# Patient Record
Sex: Female | Born: 1975 | Race: White | Hispanic: No | Marital: Married | Smoking: Current every day smoker
Health system: Southern US, Community
[De-identification: ages and names within clinical notes are randomized; demographics above are authoritative.]

## PROBLEM LIST (undated history)

## (undated) DIAGNOSIS — F319 Bipolar disorder, unspecified: Secondary | ICD-10-CM

## (undated) DIAGNOSIS — F419 Anxiety disorder, unspecified: Secondary | ICD-10-CM

## (undated) HISTORY — PX: KNEE SURGERY: SHX244

## (undated) HISTORY — PX: TUBAL LIGATION: SHX77

## (undated) HISTORY — PX: SALPINGECTOMY: SHX328

---

## 2003-12-20 ENCOUNTER — Other Ambulatory Visit: Payer: Self-pay

## 2004-10-21 ENCOUNTER — Emergency Department: Payer: Self-pay | Admitting: Emergency Medicine

## 2004-10-29 ENCOUNTER — Emergency Department: Payer: Self-pay | Admitting: Emergency Medicine

## 2004-12-03 ENCOUNTER — Emergency Department: Payer: Self-pay | Admitting: Unknown Physician Specialty

## 2004-12-05 ENCOUNTER — Ambulatory Visit: Payer: Self-pay | Admitting: Unknown Physician Specialty

## 2005-01-12 ENCOUNTER — Ambulatory Visit: Payer: Self-pay | Admitting: Obstetrics & Gynecology

## 2005-04-20 ENCOUNTER — Ambulatory Visit: Payer: Self-pay

## 2005-04-27 ENCOUNTER — Observation Stay: Payer: Self-pay

## 2005-05-25 ENCOUNTER — Observation Stay: Payer: Self-pay | Admitting: Obstetrics & Gynecology

## 2005-06-21 ENCOUNTER — Observation Stay: Payer: Self-pay

## 2005-07-03 ENCOUNTER — Observation Stay: Payer: Self-pay

## 2005-07-04 ENCOUNTER — Inpatient Hospital Stay: Payer: Self-pay | Admitting: Unknown Physician Specialty

## 2006-01-29 ENCOUNTER — Emergency Department: Payer: Self-pay | Admitting: Emergency Medicine

## 2006-04-04 ENCOUNTER — Emergency Department: Payer: Self-pay | Admitting: Emergency Medicine

## 2006-08-14 ENCOUNTER — Inpatient Hospital Stay: Payer: Self-pay

## 2006-09-27 ENCOUNTER — Ambulatory Visit: Payer: Self-pay | Admitting: Obstetrics & Gynecology

## 2007-06-07 ENCOUNTER — Emergency Department: Payer: Self-pay | Admitting: Unknown Physician Specialty

## 2007-09-14 ENCOUNTER — Emergency Department: Payer: Self-pay | Admitting: Emergency Medicine

## 2007-11-29 ENCOUNTER — Emergency Department: Payer: Self-pay | Admitting: Emergency Medicine

## 2008-05-27 ENCOUNTER — Emergency Department: Payer: Self-pay | Admitting: Emergency Medicine

## 2008-06-03 ENCOUNTER — Emergency Department: Payer: Self-pay | Admitting: Emergency Medicine

## 2008-12-01 ENCOUNTER — Emergency Department: Payer: Self-pay | Admitting: Emergency Medicine

## 2009-01-05 ENCOUNTER — Emergency Department: Payer: Self-pay | Admitting: Emergency Medicine

## 2009-09-15 ENCOUNTER — Emergency Department: Payer: Self-pay | Admitting: Emergency Medicine

## 2009-09-26 ENCOUNTER — Emergency Department: Payer: Self-pay | Admitting: Unknown Physician Specialty

## 2009-12-27 ENCOUNTER — Emergency Department: Payer: Self-pay | Admitting: Emergency Medicine

## 2010-03-15 ENCOUNTER — Emergency Department: Payer: Self-pay | Admitting: Emergency Medicine

## 2010-04-04 ENCOUNTER — Emergency Department (HOSPITAL_COMMUNITY): Admission: EM | Admit: 2010-04-04 | Discharge: 2010-04-04 | Payer: Self-pay | Admitting: Emergency Medicine

## 2010-04-11 ENCOUNTER — Emergency Department (HOSPITAL_COMMUNITY): Admission: EM | Admit: 2010-04-11 | Discharge: 2010-04-11 | Payer: Self-pay | Admitting: Emergency Medicine

## 2010-04-17 ENCOUNTER — Emergency Department: Payer: Self-pay | Admitting: Emergency Medicine

## 2010-05-23 ENCOUNTER — Emergency Department: Payer: Self-pay | Admitting: Unknown Physician Specialty

## 2010-06-18 ENCOUNTER — Emergency Department: Payer: Self-pay | Admitting: Emergency Medicine

## 2010-08-21 ENCOUNTER — Emergency Department: Payer: Self-pay | Admitting: Emergency Medicine

## 2010-10-24 ENCOUNTER — Emergency Department: Payer: Self-pay | Admitting: Emergency Medicine

## 2010-11-09 ENCOUNTER — Emergency Department (HOSPITAL_COMMUNITY)
Admission: EM | Admit: 2010-11-09 | Discharge: 2010-11-09 | Payer: Self-pay | Source: Home / Self Care | Admitting: Emergency Medicine

## 2011-01-04 ENCOUNTER — Emergency Department: Payer: Self-pay | Admitting: Unknown Physician Specialty

## 2011-01-15 ENCOUNTER — Emergency Department: Payer: Self-pay | Admitting: Emergency Medicine

## 2011-01-16 ENCOUNTER — Emergency Department (HOSPITAL_COMMUNITY): Payer: Medicaid Other

## 2011-01-16 ENCOUNTER — Emergency Department (HOSPITAL_COMMUNITY)
Admission: EM | Admit: 2011-01-16 | Discharge: 2011-01-16 | Disposition: A | Discharge status: Home / Self Care | Payer: Medicaid Other | Attending: Emergency Medicine | Admitting: Emergency Medicine

## 2011-01-16 DIAGNOSIS — M79609 Pain in unspecified limb: Secondary | ICD-10-CM | POA: Insufficient documentation

## 2011-01-16 DIAGNOSIS — S60229A Contusion of unspecified hand, initial encounter: Secondary | ICD-10-CM | POA: Insufficient documentation

## 2011-01-16 DIAGNOSIS — W2209XA Striking against other stationary object, initial encounter: Secondary | ICD-10-CM | POA: Insufficient documentation

## 2011-01-16 DIAGNOSIS — F319 Bipolar disorder, unspecified: Secondary | ICD-10-CM | POA: Insufficient documentation

## 2011-01-16 DIAGNOSIS — Z79899 Other long term (current) drug therapy: Secondary | ICD-10-CM | POA: Insufficient documentation

## 2011-01-16 DIAGNOSIS — Y92009 Unspecified place in unspecified non-institutional (private) residence as the place of occurrence of the external cause: Secondary | ICD-10-CM | POA: Insufficient documentation

## 2011-01-16 DIAGNOSIS — S6990XA Unspecified injury of unspecified wrist, hand and finger(s), initial encounter: Secondary | ICD-10-CM | POA: Insufficient documentation

## 2011-01-16 DIAGNOSIS — J45909 Unspecified asthma, uncomplicated: Secondary | ICD-10-CM | POA: Insufficient documentation

## 2011-01-30 ENCOUNTER — Emergency Department: Payer: Self-pay | Admitting: Emergency Medicine

## 2011-10-11 ENCOUNTER — Emergency Department: Payer: Self-pay

## 2011-11-16 IMAGING — CR DG HAND COMPLETE 3+V*R*
3 series · 3 of 3 positions shown · non-contrast
Comparison: None

CLINICAL DATA: Punched wall with right hand

RIGHT HAND - COMPLETE 3+ VIEW

[x hand pa right]
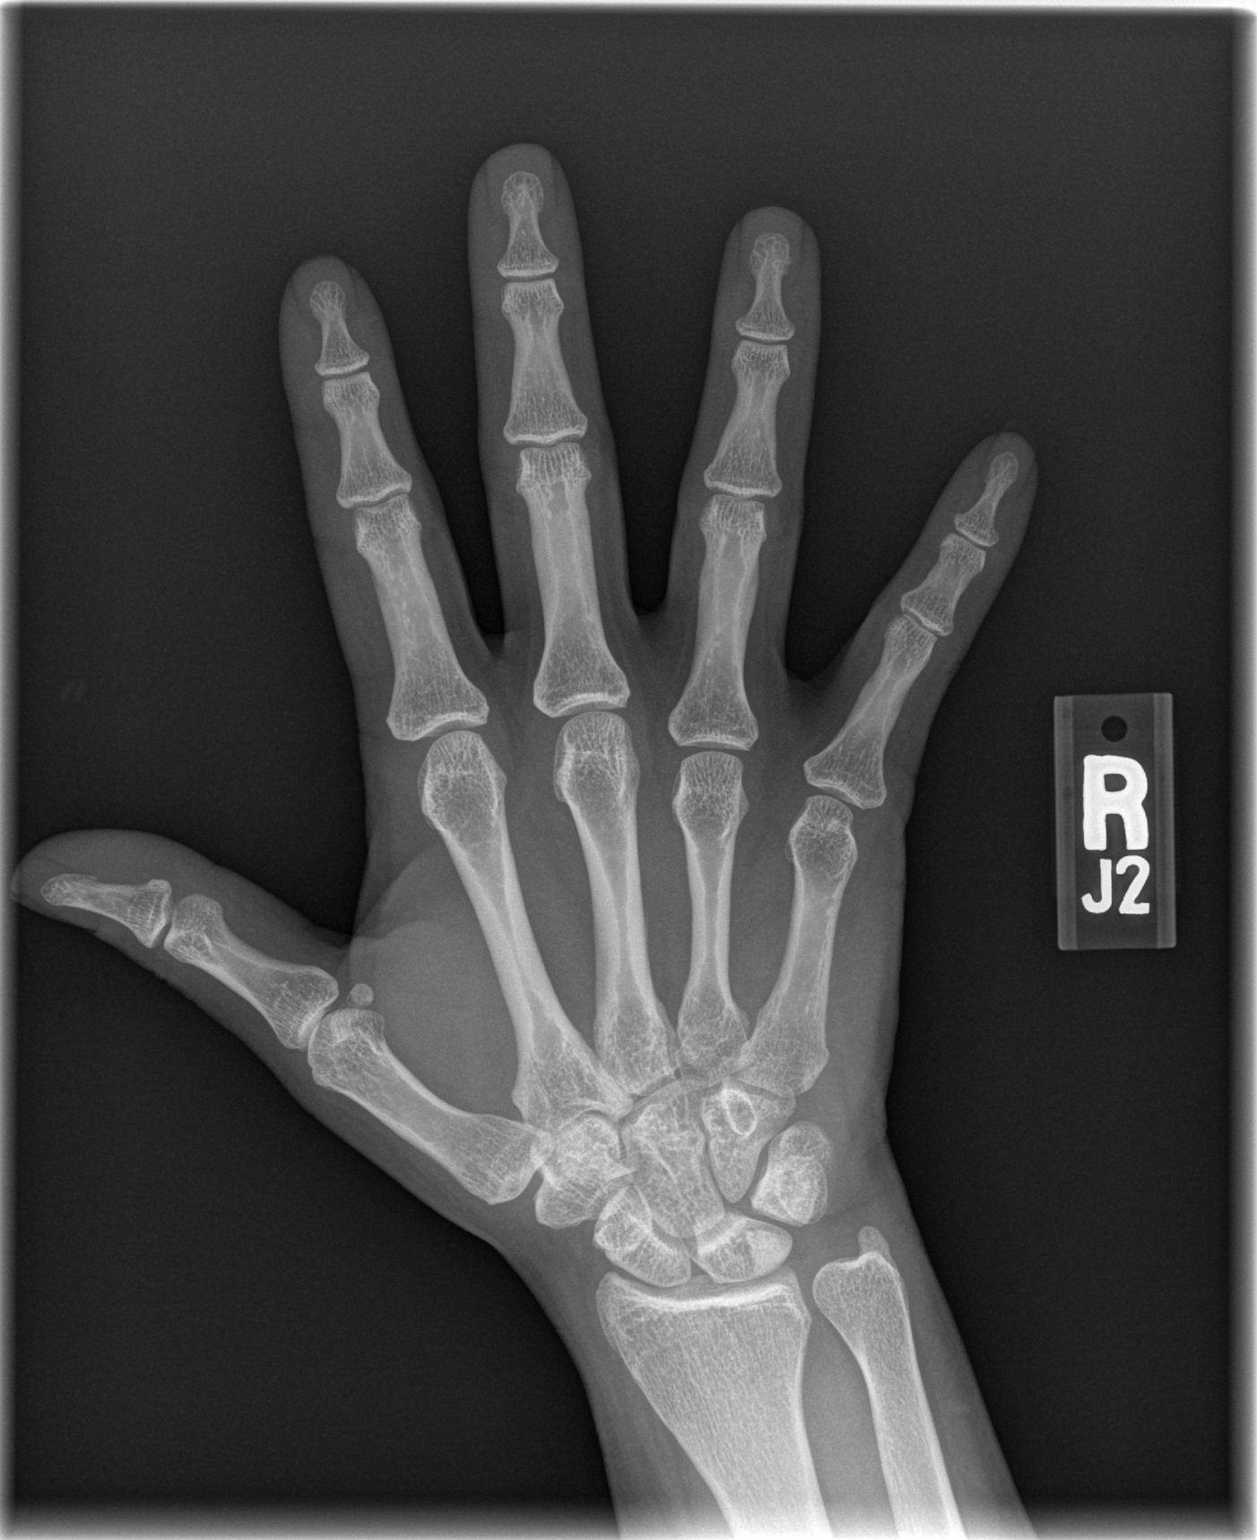

[x hand oblique right]
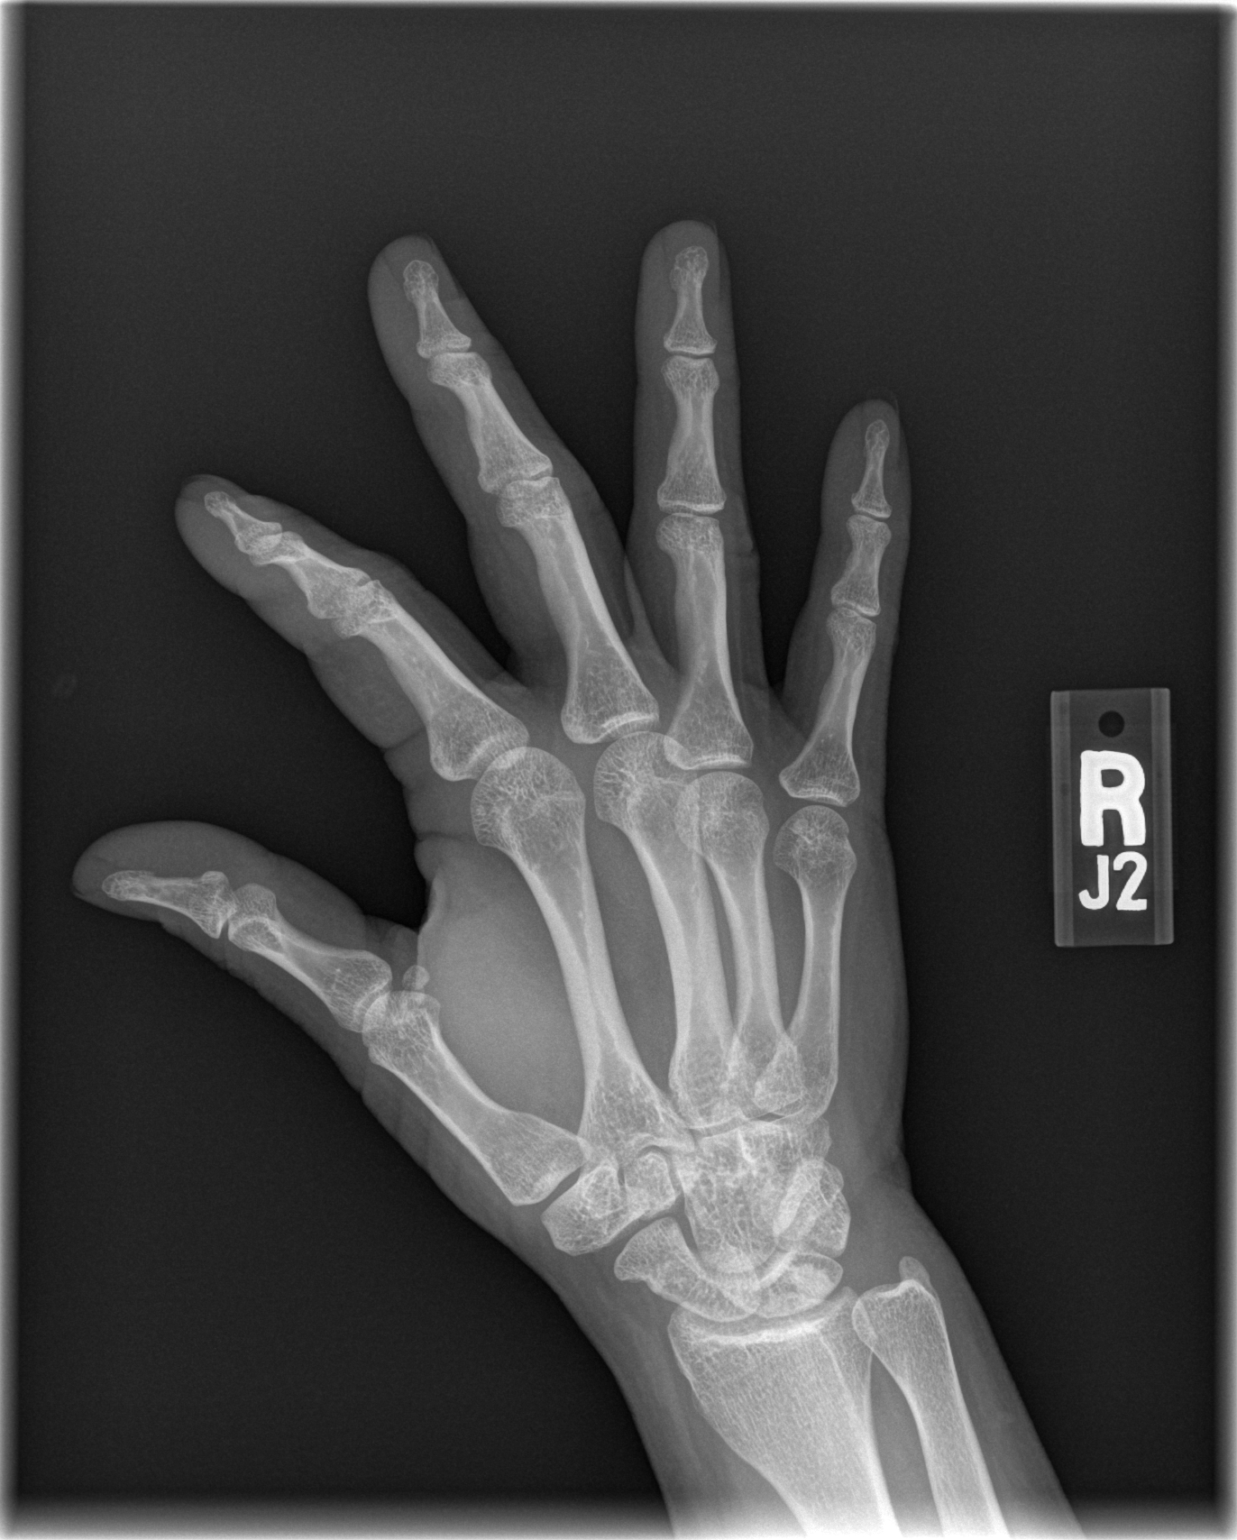

[x hand lat right]
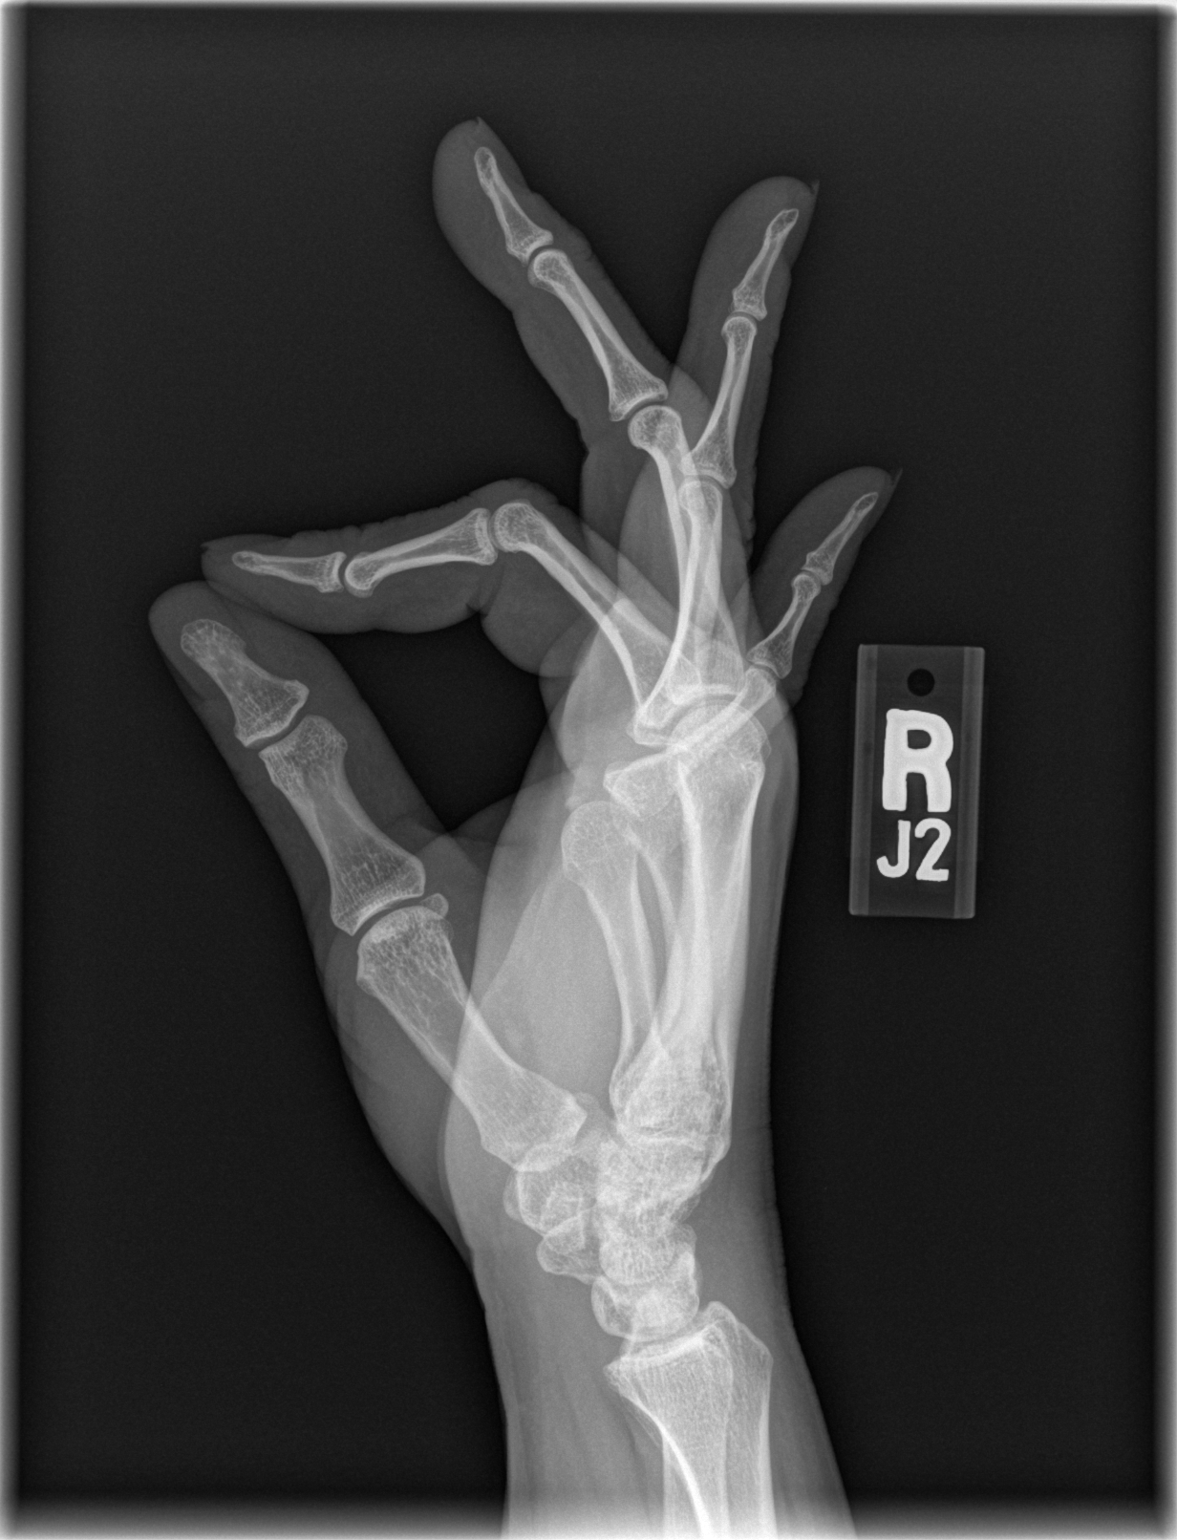

[3 of 3 positions shown; findings below may reference images not displayed]

FINDINGS: There is no evidence of fracture or dislocation.  There
is no evidence of arthropathy or other focal bone abnormality.
Soft tissues are unremarkable.
IMPRESSION: No acute findings.

## 2011-12-03 ENCOUNTER — Emergency Department: Payer: Self-pay | Admitting: Emergency Medicine

## 2011-12-03 LAB — CBC WITH DIFFERENTIAL/PLATELET
Basophil %: 0.6 %
Eosinophil #: 0.1 10*3/uL (ref 0.0–0.7)
MCH: 30.7 pg (ref 26.0–34.0)
MCHC: 33.6 g/dL (ref 32.0–36.0)
Monocyte #: 0.5 10*3/uL (ref 0.0–0.7)
Neutrophil %: 60 %
Platelet: 233 10*3/uL (ref 150–440)

## 2011-12-03 LAB — COMPREHENSIVE METABOLIC PANEL
Albumin: 3.8 g/dL (ref 3.4–5.0)
Anion Gap: 7 (ref 7–16)
BUN: 12 mg/dL (ref 7–18)
Calcium, Total: 9.3 mg/dL (ref 8.5–10.1)
Glucose: 75 mg/dL (ref 65–99)
SGOT(AST): 20 U/L (ref 15–37)
SGPT (ALT): 29 U/L
Total Protein: 7.2 g/dL (ref 6.4–8.2)

## 2011-12-03 LAB — LIPASE, BLOOD: Lipase: 235 U/L (ref 73–393)

## 2011-12-04 LAB — URINALYSIS, COMPLETE
Ketone: NEGATIVE
Ph: 5 (ref 4.5–8.0)
Protein: NEGATIVE
Specific Gravity: 1.027 (ref 1.003–1.030)
Squamous Epithelial: 6
WBC UR: 1 /HPF (ref 0–5)

## 2011-12-04 LAB — WET PREP, GENITAL

## 2011-12-04 LAB — PREGNANCY, URINE: Pregnancy Test, Urine: NEGATIVE m[IU]/mL

## 2011-12-13 ENCOUNTER — Emergency Department: Payer: Self-pay | Admitting: *Deleted

## 2011-12-13 LAB — URINALYSIS, COMPLETE
Bacteria: NONE SEEN
Bilirubin,UR: NEGATIVE
Glucose,UR: NEGATIVE mg/dL (ref 0–75)
Ketone: NEGATIVE
Leukocyte Esterase: NEGATIVE
Ph: 6 (ref 4.5–8.0)
RBC,UR: 9 /HPF (ref 0–5)
Squamous Epithelial: 4
WBC UR: 1 /HPF (ref 0–5)

## 2011-12-13 LAB — PREGNANCY, URINE: Pregnancy Test, Urine: NEGATIVE m[IU]/mL

## 2012-02-12 ENCOUNTER — Emergency Department: Payer: Self-pay | Admitting: Emergency Medicine

## 2012-07-02 ENCOUNTER — Emergency Department: Payer: Self-pay | Admitting: Emergency Medicine

## 2012-07-02 LAB — URINALYSIS, COMPLETE
Bacteria: NONE SEEN
Bilirubin,UR: NEGATIVE
Glucose,UR: NEGATIVE mg/dL (ref 0–75)
Leukocyte Esterase: NEGATIVE
Specific Gravity: 1.014 (ref 1.003–1.030)
Squamous Epithelial: 14
WBC UR: 1 /HPF (ref 0–5)

## 2012-07-15 ENCOUNTER — Emergency Department: Payer: Self-pay | Admitting: Emergency Medicine

## 2012-07-15 LAB — CBC
HCT: 42.4 % (ref 35.0–47.0)
HGB: 14.1 g/dL (ref 12.0–16.0)
MCH: 30.4 pg (ref 26.0–34.0)
MCHC: 33.2 g/dL (ref 32.0–36.0)
RDW: 13.4 % (ref 11.5–14.5)

## 2012-07-15 LAB — URINALYSIS, COMPLETE
Bacteria: NONE SEEN
Bilirubin,UR: NEGATIVE
Glucose,UR: NEGATIVE mg/dL (ref 0–75)
Ketone: NEGATIVE
Leukocyte Esterase: NEGATIVE
Ph: 6 (ref 4.5–8.0)
RBC,UR: <1 /HPF (ref 0–5)
Squamous Epithelial: <1
WBC UR: 1 /HPF (ref 0–5)

## 2012-07-15 LAB — COMPREHENSIVE METABOLIC PANEL
Albumin: 3.7 g/dL (ref 3.4–5.0)
Alkaline Phosphatase: 62 U/L (ref 50–136)
Calcium, Total: 9.1 mg/dL (ref 8.5–10.1)
Glucose: 93 mg/dL (ref 65–99)
Osmolality: 281 (ref 275–301)
Sodium: 142 mmol/L (ref 136–145)

## 2012-07-15 LAB — PREGNANCY, URINE: Pregnancy Test, Urine: NEGATIVE m[IU]/mL

## 2012-08-05 ENCOUNTER — Ambulatory Visit: Payer: Self-pay | Admitting: Obstetrics & Gynecology

## 2012-08-05 LAB — CBC
HGB: 13.1 g/dL (ref 12.0–16.0)
MCH: 31.1 pg (ref 26.0–34.0)
RBC: 4.21 10*6/uL (ref 3.80–5.20)
WBC: 11.9 10*3/uL — ABNORMAL HIGH (ref 3.6–11.0)

## 2012-08-06 ENCOUNTER — Ambulatory Visit: Payer: Self-pay | Admitting: Obstetrics & Gynecology

## 2012-08-06 DIAGNOSIS — I498 Other specified cardiac arrhythmias: Secondary | ICD-10-CM

## 2012-10-02 ENCOUNTER — Emergency Department: Payer: Self-pay

## 2012-10-12 IMAGING — CT CT STONE STUDY
1 of 2 series · 15 of 32 positions shown, 19 images · non-contrast
Comparison: none

REASON FOR EXAM: left sided pain
COMMENTS:

PROCEDURE:     CT  - CT ABDOMEN /PELVIS WO (STONE)  - December 13, 2011  [DATE]
RESULT:     CT abdomen and pelvis dated 12/13/2011.
TECHNIQUE: Helical noncontrasted 3 mm sections were obtained from lung bases
through the pubic symphysis.

[Series 2: stone · axial · 0.73mm/px · z∈[-470,-62]mm · 15 of 154 slices shown, 19 images]
[im 12/154  soft-tissue]
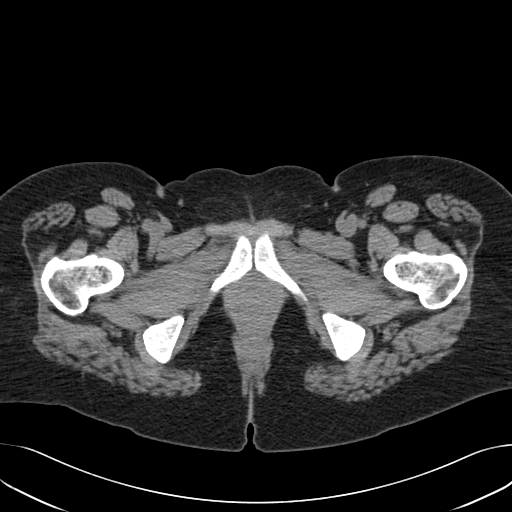
[im 12/154  bone]
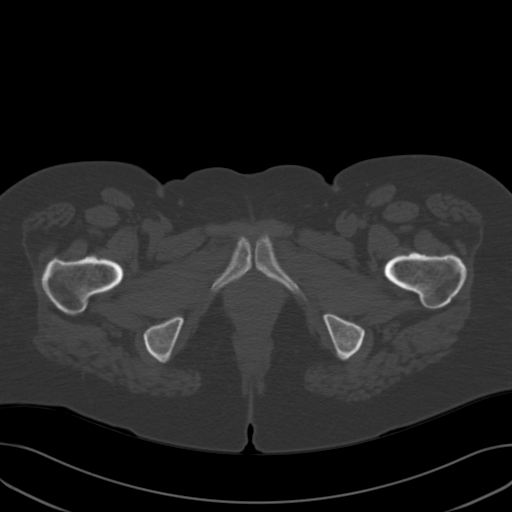
[im 23/154  soft-tissue]
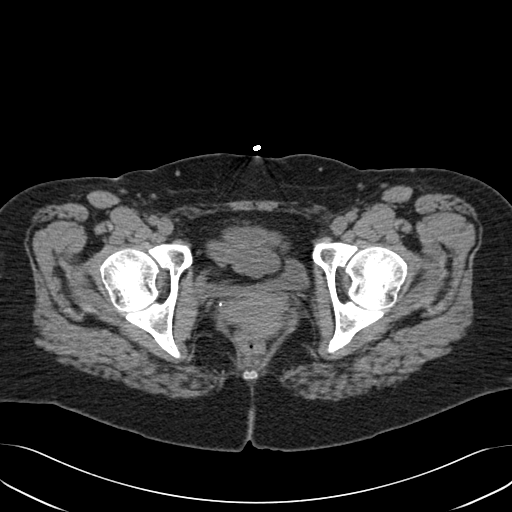
[im 35/154  soft-tissue]
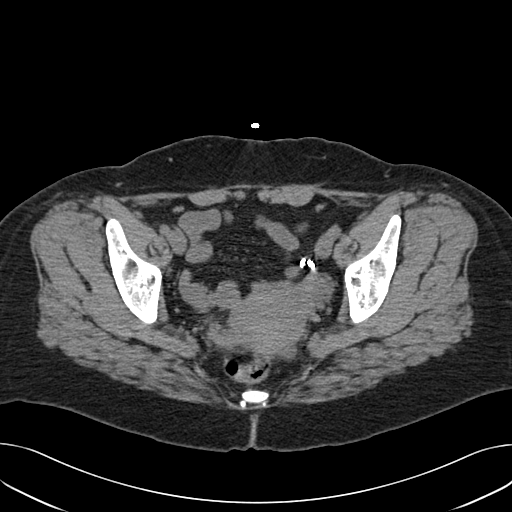
[im 46/154  soft-tissue]
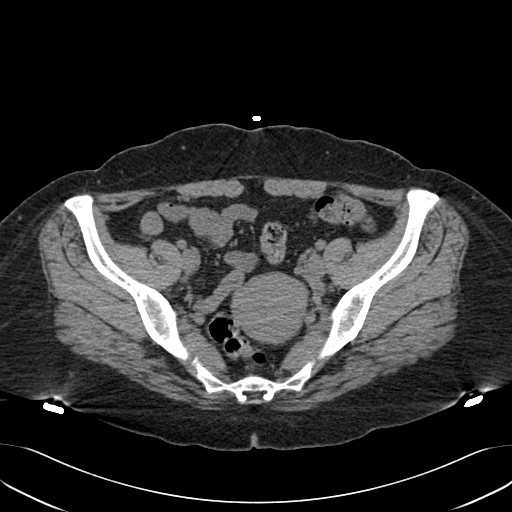
[im 57/154  soft-tissue]
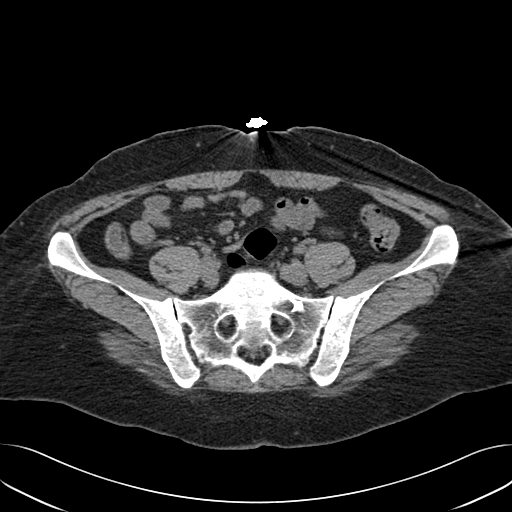
[im 69/154  soft-tissue]
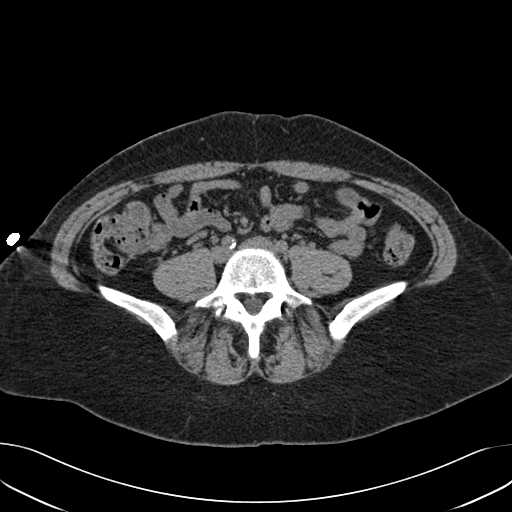
[im 80/154  soft-tissue]
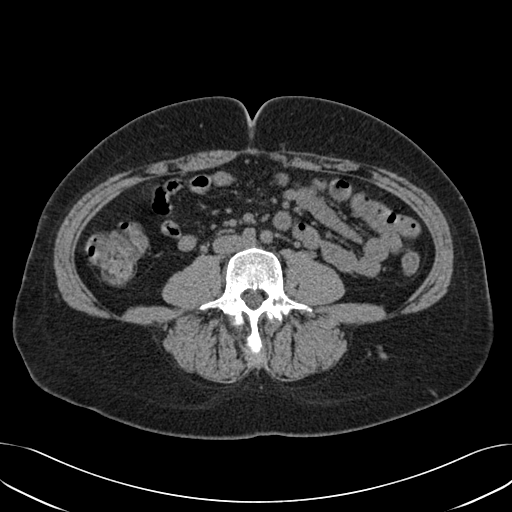
[im 91/154  soft-tissue]
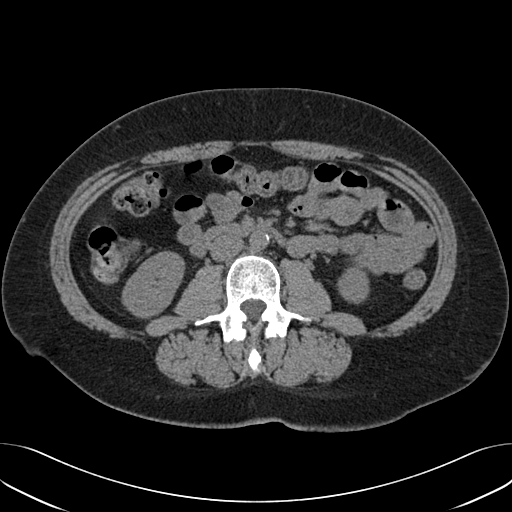
[im 103/154  soft-tissue]
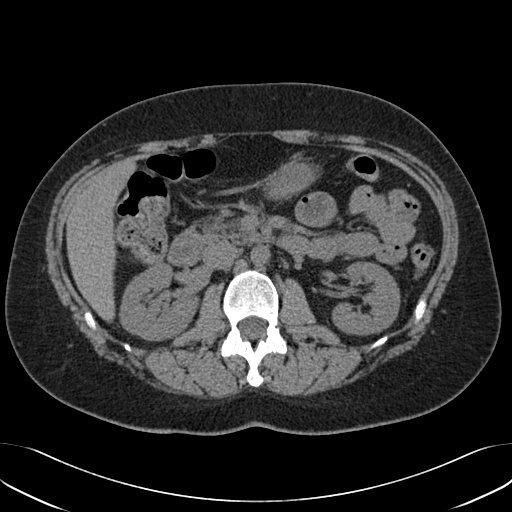
[im 103/154  bone]
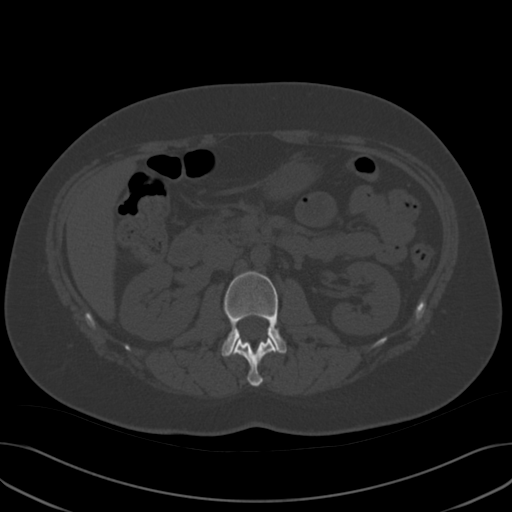
[im 114/154  soft-tissue]
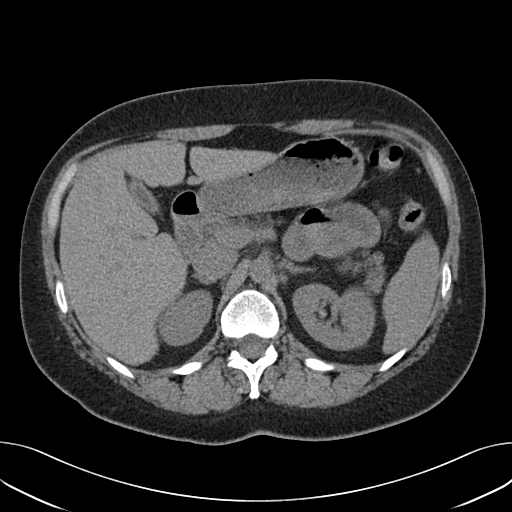
[im 125/154  soft-tissue]
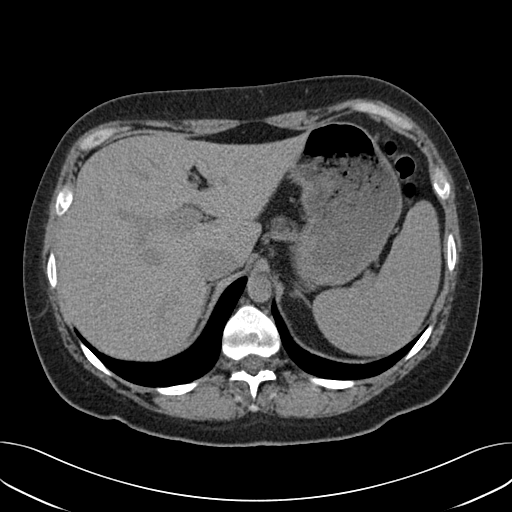
[im 131/154  lung]
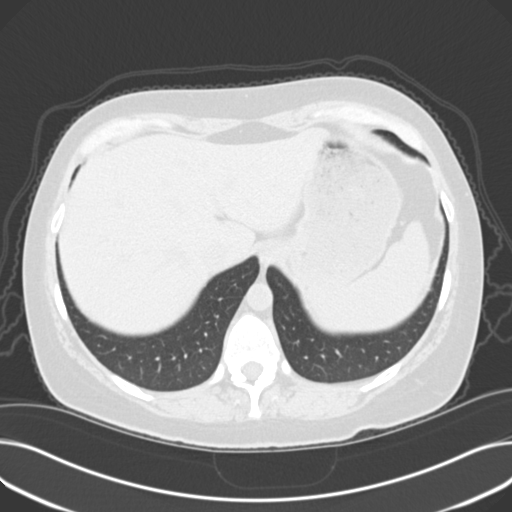
[im 137/154  soft-tissue]
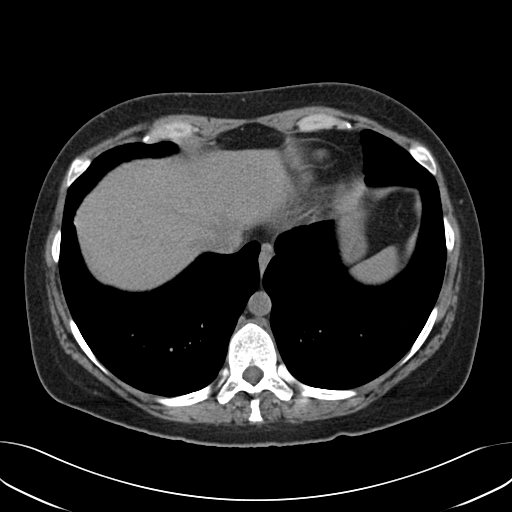
[im 137/154  lung]
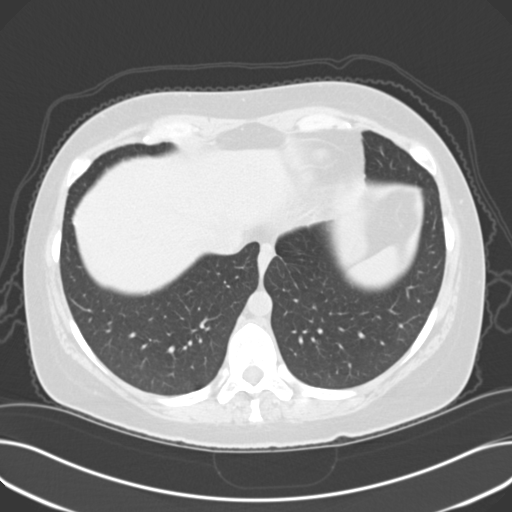
[im 142/154  lung]
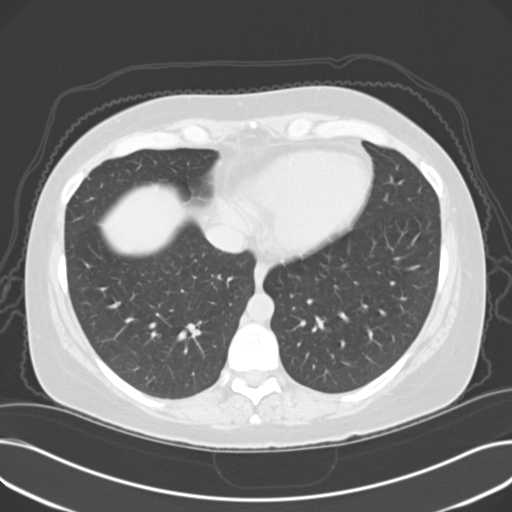
[im 148/154  soft-tissue]
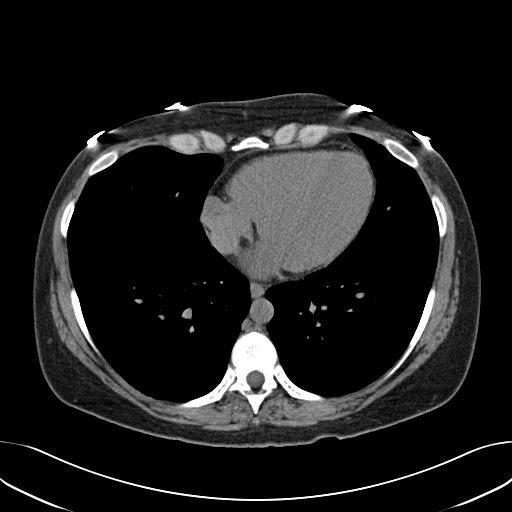
[im 148/154  lung]
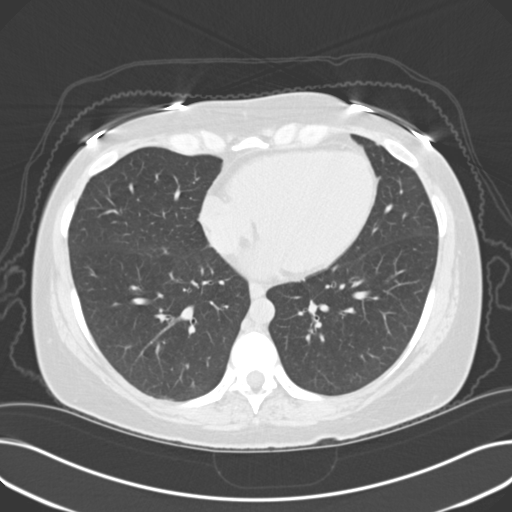

[15 of 32 positions shown; findings below may reference images not displayed]

FINDINGS: The lung bases demonstrate a calcified granuloma within the
posterior right lung base, otherwise unremarkable.

Noncontrast evaluation of the liver, spleen, adrenals, pancreas are
unremarkable.

No evidence of hydronephrosis, hydroureter, nor nephrolithiasis. There is no
evidence of ureterolithiasis.

No CT evidence of bowel obstruction nor secondary signs reflecting
enteritis, colitis, diverticulitis, nor appendicitis the appendix is
identified and is unremarkable. Nonspecific subcentimeter retroperitoneal
lymph nodes identified. No evidence of an abnormal aortic aneurysm.
IMPRESSION: No CT evidence of obstructive or inflammatory abnormalities.

## 2013-01-22 ENCOUNTER — Emergency Department: Payer: Self-pay | Admitting: Emergency Medicine

## 2013-03-23 ENCOUNTER — Emergency Department: Payer: Self-pay | Admitting: Internal Medicine

## 2013-10-16 ENCOUNTER — Encounter: Payer: Self-pay | Admitting: Anesthesiology

## 2013-10-27 ENCOUNTER — Encounter: Payer: Self-pay | Admitting: Anesthesiology

## 2014-10-16 ENCOUNTER — Emergency Department: Payer: Self-pay | Admitting: Internal Medicine

## 2014-10-16 LAB — COMPREHENSIVE METABOLIC PANEL
ANION GAP: 4 — AB (ref 7–16)
Albumin: 3.7 g/dL (ref 3.4–5.0)
Alkaline Phosphatase: 60 U/L
BILIRUBIN TOTAL: 0.2 mg/dL (ref 0.2–1.0)
BUN: 13 mg/dL (ref 7–18)
CREATININE: 0.91 mg/dL (ref 0.60–1.30)
Calcium, Total: 8.6 mg/dL (ref 8.5–10.1)
Chloride: 106 mmol/L (ref 98–107)
Co2: 30 mmol/L (ref 21–32)
GLUCOSE: 89 mg/dL (ref 65–99)
OSMOLALITY: 279 (ref 275–301)
Potassium: 4.1 mmol/L (ref 3.5–5.1)
SGOT(AST): 17 U/L (ref 15–37)
SGPT (ALT): 16 U/L
Sodium: 140 mmol/L (ref 136–145)
Total Protein: 7.2 g/dL (ref 6.4–8.2)

## 2014-10-16 LAB — CBC
HCT: 43 % (ref 35.0–47.0)
HGB: 13.9 g/dL (ref 12.0–16.0)
MCH: 30 pg (ref 26.0–34.0)
MCHC: 32.2 g/dL (ref 32.0–36.0)
MCV: 93 fL (ref 80–100)
PLATELETS: 216 10*3/uL (ref 150–440)
RBC: 4.62 10*6/uL (ref 3.80–5.20)
RDW: 13.2 % (ref 11.5–14.5)
WBC: 11 10*3/uL (ref 3.6–11.0)

## 2014-10-16 LAB — URINALYSIS, COMPLETE
RBC,UR: 1211 /HPF (ref 0–5)
Specific Gravity: 1.024 (ref 1.003–1.030)
WBC UR: 10 /HPF (ref 0–5)

## 2014-10-19 ENCOUNTER — Emergency Department: Payer: Self-pay | Admitting: Emergency Medicine

## 2014-10-19 LAB — URINALYSIS, COMPLETE
Bilirubin,UR: NEGATIVE
GLUCOSE, UR: NEGATIVE mg/dL (ref 0–75)
KETONE: NEGATIVE
LEUKOCYTE ESTERASE: NEGATIVE
NITRITE: NEGATIVE
PH: 7 (ref 4.5–8.0)
Protein: NEGATIVE
RBC,UR: 11 /HPF (ref 0–5)
SPECIFIC GRAVITY: 1.017 (ref 1.003–1.030)

## 2014-10-20 LAB — URINE CULTURE

## 2014-10-25 ENCOUNTER — Emergency Department: Payer: Self-pay | Admitting: Emergency Medicine

## 2014-10-25 LAB — URINALYSIS, COMPLETE
Bilirubin,UR: NEGATIVE
GLUCOSE, UR: NEGATIVE mg/dL (ref 0–75)
KETONE: NEGATIVE
NITRITE: NEGATIVE
PH: 6 (ref 4.5–8.0)
Protein: NEGATIVE
RBC,UR: 7 /HPF (ref 0–5)
Specific Gravity: 1.018 (ref 1.003–1.030)
Squamous Epithelial: 46

## 2014-11-17 ENCOUNTER — Emergency Department: Payer: Self-pay | Admitting: Emergency Medicine

## 2014-11-17 ENCOUNTER — Ambulatory Visit: Payer: Self-pay | Admitting: Urology

## 2014-11-23 ENCOUNTER — Emergency Department: Payer: Self-pay | Admitting: Emergency Medicine

## 2014-11-23 LAB — URINALYSIS, COMPLETE
BILIRUBIN, UR: NEGATIVE
GLUCOSE, UR: NEGATIVE mg/dL (ref 0–75)
KETONE: NEGATIVE
Leukocyte Esterase: NEGATIVE
NITRITE: NEGATIVE
PH: 5 (ref 4.5–8.0)
PROTEIN: NEGATIVE
RBC,UR: 6 /HPF (ref 0–5)
Specific Gravity: 1.015 (ref 1.003–1.030)
Squamous Epithelial: 4

## 2014-11-23 LAB — DRUG SCREEN, URINE
Amphetamines, Ur Screen: NEGATIVE (ref ?–1000)
BARBITURATES, UR SCREEN: NEGATIVE (ref ?–200)
Benzodiazepine, Ur Scrn: POSITIVE (ref ?–200)
CANNABINOID 50 NG, UR ~~LOC~~: POSITIVE (ref ?–50)
COCAINE METABOLITE, UR ~~LOC~~: NEGATIVE (ref ?–300)
MDMA (ECSTASY) UR SCREEN: NEGATIVE (ref ?–500)
Methadone, Ur Screen: NEGATIVE (ref ?–300)
Opiate, Ur Screen: POSITIVE (ref ?–300)
Phencyclidine (PCP) Ur S: NEGATIVE (ref ?–25)
Tricyclic, Ur Screen: NEGATIVE (ref ?–1000)

## 2014-12-28 ENCOUNTER — Emergency Department: Payer: Self-pay | Admitting: Emergency Medicine

## 2015-02-11 ENCOUNTER — Ambulatory Visit: Payer: Self-pay | Admitting: Obstetrics & Gynecology

## 2015-02-18 ENCOUNTER — Ambulatory Visit: Payer: Self-pay | Admitting: Obstetrics & Gynecology

## 2015-02-18 LAB — CBC
HCT: 41.9 % (ref 35.0–47.0)
HGB: 13.6 g/dL (ref 12.0–16.0)
MCH: 29.7 pg (ref 26.0–34.0)
MCHC: 32.4 g/dL (ref 32.0–36.0)
MCV: 92 fL (ref 80–100)
Platelet: 257 10*3/uL (ref 150–440)
RBC: 4.58 10*6/uL (ref 3.80–5.20)
RDW: 13.2 % (ref 11.5–14.5)
WBC: 9.5 10*3/uL (ref 3.6–11.0)

## 2015-02-23 ENCOUNTER — Ambulatory Visit: Payer: Self-pay | Admitting: Obstetrics & Gynecology

## 2015-03-16 NOTE — Op Note (Signed)
PATIENT NAME:  Denise FreestoneBROWN, Kella E MR#:  [id number] DATE OF BIRTH:  1976/06/10  DATE OF PROCEDURE:  08/06/2012  PREOPERATIVE DIAGNOSIS: Chronic pelvic pain.   POSTOPERATIVE DIAGNOSIS: Chronic pelvic pain.   PROCEDURE: Laparoscopy with left salpingectomy.   SURGEON: Dierdre Searles. Paul Kyshaun Barnette, MD  ANESTHESIA: General.   ESTIMATED BLOOD LOSS: Minimal.   COMPLICATIONS: None.   FINDINGS: No evidence for adhesions or endometriosis.   DISPOSITION: To recovery room in stable condition.   TECHNIQUE: Patient is prepped and draped in the usual sterile fashion after adequate anesthesia is obtained in the dorsal lithotomy position. Bladder is drained with a Robinson catheter and a sponge stick is placed per vagina for manipulation purposes.   Attention is then turned to the abdomen where a Veress needle is inserted through a 5 mm infraumbilical incision after Marcaine is used to anesthetize the skin. Veress needle placement is confirmed using the hanging drop technique. The abdomen is then insufflated with CO2 gas. 5 mm trocar inserted under direct visualization with the laparoscope with no injuries or bleeding noted. Patient is placed in Trendelenburg position with the above-mentioned findings visualized.   A left lower quadrant 11 mm trocar is placed and a 5 mm suprapubic trocar is placed for manipulation instruments for the procedure. The left fallopian tube is grasped, identified. Two Hulka clips are in place on this fallopian tube. There is no significant damage to the tube but due to the fact that she has chronic pain on the left side we are going to remove the left fallopian tube. The left ovary is normal and there is no evidence for endometriosis, adhesions, pelvic congestion or other problems. The right fallopian tube has already been removed from a prior procedure and the right ovary is normal in appearance as is the uterus.   The left fallopian tube is grasped and carefully dissected using the 5 mm Harmonic  scalpel with preservation of left ovary and its main blood supply. The Hulka clips are included with the specimen and it is completely excised to the level of the cornua of the uterus. Excellent hemostasis is noted. The uterus is removed through the 11 mm port without difficulty. Gas is expelled. Trocars are removed. Skin is closed with Dermabond. Patient goes to recovery room in stable condition. All sponge, instrument, and needle counts are correct.    ____________________________ R. Annamarie MajorPaul Nieko Clarin, MD rph:cms D: 08/06/2012 15:59:27 ET T: 08/06/2012 16:27:06 ET JOB#: [id number]  cc: Dierdre Searles. Paul Yoan Sallade, MD, <Dictator>  Nadara MustardOBERT P Korbyn Chopin MD ELECTRONICALLY SIGNED 08/15/2012 17:58

## 2015-03-16 NOTE — Consult Note (Signed)
    General Aspect [age] yo female with history of one syncopal episode in the past who was noted to get bradycaradic duriing aneswthesia induction for a gynechologic procedure today. She recieved atropine with recovery of her rythm. She is now in pacu in n[place]. EKG is normal. She has no complaints. Syncopal. episode in the past occurrend at night when she awoke form sleep to tend to her son who was having a nightmarfe. She had no further episodes.   Physical Exam:   GEN well developed, well nourished, no acute distress    HEENT PERRL    NECK supple    RESP normal resp effort  clear BS    CARD Regular rate and rhythm  Normal, S1, S2  No murmur    ABD post abdominal procedure    LYMPH negative neck    EXTR negative cyanosis/clubbing    SKIN normal to palpation    NEURO cranial nerves intact, motor/sensory function intact    PSYCH A+O to time, place, person   Review of Systems:   General: No Complaints    Skin: No Complaints    ENT: No Complaints    Eyes: No Complaints    Neck: No Complaints    Respiratory: No Complaints    Cardiovascular: No Complaints    Gastrointestinal: No Complaints    Genitourinary: No Complaints    Vascular: No Complaints    Musculoskeletal: No Complaints    Neurologic: No Complaints    Hematologic: No Complaints    Endocrine: No Complaints    Psychiatric: No Complaints    Review of Systems: All other systems were reviewed and found to be negative    Medications/Allergies Reviewed Medications/Allergies reviewed     Kidney Stones:    Anemia: h/o   Ovarian Cyst:    Bulging Disk:    Bipolar Disorder:    asthma:    Tubal Ligation:    right knee surgery:    Ectopic Pregnancy:   Home Medications: Medication Instructions Status  Saphris 10 mg sublingual tablet 1 tab(s) sublingual 2 times a day Active  alprazolam 1 mg oral tablet 1 tab(s) orally 4 times a day Active  Lamictal 200 mg oral tablet 1 tab(s) orally once a day (at  bedtime) Active  albuterol-ipratropium 103 mcg-18 mcg/inh inhalation aerosol 2 puff(s) inhaled 4 times a day, As Needed Active  prazosin 2 mg oral capsule 1 cap(s) orally once a day (at bedtime) Active  Percocet 10/325 oral tablet 1 tab(s) orally every 6 hours, As Needed Active   EKG:   EKG Nml    Depakote: Swelling  Duratuss: Hives  Biaxin: Other  Hydrocodone: N/V/Diarrhea  Ultram: Rash  Codeine: Itching  NSAIDS: Other    Impression 39 yo with an asystolic episode duriing induction of anesthesia. Had another syncoopal episode previously. No other symtpoms. Etioogy is likely secondary to the andesthesia and surgery.  Resting ekg is normal    Plan 1.. OK for discharge to home 2. FOllow up with cardiologyh in 1-2 weeks.   Electronic Signatures: Dalia HeadingFath, Matison Nuccio A (MD)  (Signed 10-Sep-13 17:07)  Authored: General Aspect/Present Illness, History and Physical Exam, Review of System, Past Medical History, Home Medications, EKG , Allergies, Impression/Plan   Last Updated: 10-Sep-13 17:07 by Dalia HeadingFath, Everli Rother A (MD)

## 2015-03-28 NOTE — Op Note (Signed)
PATIENT NAME:  [name], Denise E MR#:  [id number] DATE OF BIRTH:  1976/07/14  DATE OF PROCEDURE:  02/23/2015  PREOPERATIVE DIAGNOSIS: Left lower quadrant pain, ovarian cyst.  POSTOPERATIVE DIAGNOSIS: Left lower quadrant pain.  PROCEDURE: Diagnostic laparoscopy.  SURGEON: Dierdre Searles. Paul Delray Reza, MD  ANESTHESIA: General.  ESTIMATED BLOOD LOSS: Minimal.  COMPLICATIONS: None.  FINDINGS: Normal tubes, ovaries and uterus. There was no evidence for left ovarian cyst. 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 There was no evidence for torsion, pelvic congestion, adhesive disease, endometriosis or other etiology for pelvic pain and left lower quadrant pain.  DISPOSITION: To recovery room in stable condition.  TECHNIQUE: The patient is prepped and draped in the usual sterile fashion, after adequate anesthesia is obtained, in the dorsal lithotomy position. Bladder is drained with a Robinson catheter. Tenaculum is placed on the cervix for manipulation purposes.   Attention is then turned to the abdomen where a Veress needle is inserted through a 5 mm infraumbilical incision after Marcaine is used to anesthetize the skin. Veress needle placement is confirmed using the hanging drop technique and the abdomen is then insufflated with CO2 gas. A 5 mm trocar is then inserted under direct visualization with the laparoscope with no injuries or bleeding noted. A thorough investigation of the pelvic and abdominal cavity is performed. The above mentioned findings are visualized. The left ovary is palpated with a grasper as well as a needle aspirator with no cyst encountered and no other abnormalities visualized or palpated. The patient is leveled, gas is expelled, and trocar is removed with skin glue applied to the skin. Tenaculum is removed from the vagina, and the patient goes to the recovery room in stable condition. All sponge, instrument and needle counts are correct.    ____________________________ R. Annamarie MajorPaul Miles Borkowski, MD rph:sb D: 02/23/2015 13:26:21 ET T: 02/23/2015 13:59:28 ET JOB#: [id number]  cc: Dierdre Searles. Paul Vidal Lampkins, MD, <Dictator> Nadara MustardOBERT P Jesenia Spera MD ELECTRONICALLY SIGNED 03/03/2015 7:15

## 2015-07-30 ENCOUNTER — Encounter: Payer: Self-pay | Admitting: Emergency Medicine

## 2015-07-30 ENCOUNTER — Emergency Department
Admission: EM | Admit: 2015-07-30 | Discharge: 2015-07-30 | Disposition: A | Discharge status: Home / Self Care | Payer: Medicaid Other | Attending: Student | Admitting: Student

## 2015-07-30 DIAGNOSIS — Z79899 Other long term (current) drug therapy: Secondary | ICD-10-CM | POA: Insufficient documentation

## 2015-07-30 DIAGNOSIS — Z720 Tobacco use: Secondary | ICD-10-CM | POA: Insufficient documentation

## 2015-07-30 DIAGNOSIS — L0231 Cutaneous abscess of buttock: Secondary | ICD-10-CM | POA: Diagnosis present

## 2015-07-30 HISTORY — DX: Anxiety disorder, unspecified: F41.9

## 2015-07-30 MED ORDER — SULFAMETHOXAZOLE-TRIMETHOPRIM 800-160 MG PO TABS: 1.0000 | ORAL_TABLET | Freq: Two times a day (BID) | ORAL | Status: DC

## 2015-07-30 MED ORDER — OXYCODONE-ACETAMINOPHEN 5-325 MG PO TABS: 1.0000 | ORAL_TABLET | ORAL | Status: DC | PRN

## 2015-07-30 NOTE — Discharge Instructions (Signed)
Abscess °An abscess (boil or furuncle) is an infected area on or under the skin. This area is filled with yellowish-white fluid (pus) and other material (debris). °HOME CARE  °· Only take medicines as told by your doctor. °· If you were given antibiotic medicine, take it as directed. Finish the medicine even if you start to feel better. °· If gauze is used, follow your doctor's directions for changing the gauze. °· To avoid spreading the infection: °¨ Keep your abscess covered with a bandage. °¨ Wash your hands well. °¨ Do not share personal care items, towels, or whirlpools with others. °¨ Avoid skin contact with others. °· Keep your skin and clothes clean around the abscess. °· Keep all doctor visits as told. °GET HELP RIGHT AWAY IF:  °· You have more pain, puffiness (swelling), or redness in the wound site. °· You have more fluid or blood coming from the wound site. °· You have muscle aches, chills, or you feel sick. °· You have a fever. °MAKE SURE YOU:  °· Understand these instructions. °· Will watch your condition. °· Will get help right away if you are not doing well or get worse. °Document Released: 05/01/2008 Document Revised: 05/14/2012 Document Reviewed: 01/26/2012 °ExitCare® Patient Information ©2015 [employer]. This information is not intended to replace advice given to you by your health care provider. Make sure you discuss any questions you have with your health care provider. ° °

## 2015-07-30 NOTE — ED Provider Notes (Signed)
[facility] Emergency Department Provider Note  ____________________________________________  Time seen: Approximately 11:11 AM  I have reviewed the triage vital signs and the nursing notes.   HISTORY  Chief Complaint Abscess   HPI STEPHENIE Montoya is a 39 y.o. female is here with complaint of a "portal or a spider bite" to her buttocks that she noticed yesterday. She states it is gotten extremely uncomfortable she was unable to sleep last night. She has never had any abscesses before but states her husband gets them all the time. She has not had any fever, chills, nausea or vomiting. She has not taken any over-the-counter medication for pain. Currently she rates her pain is 7 out of 10.   Past Medical History  Diagnosis Date  . Anxiety     There are no active problems to display for this patient.   History reviewed. No pertinent past surgical history.  Current Outpatient Rx  Name  Route  Sig  Dispense  Refill  . alprazolam (XANAX) 2 MG tablet   Oral   Take 2 mg by mouth 4 (four) times daily.         Marland Kitchen asenapine (SAPHRIS) 5 MG SUBL 24 hr tablet   Sublingual   Place 10 mg under the tongue 2 (two) times daily.         Marland Kitchen lamoTRIgine (LAMICTAL) 150 MG tablet   Oral   Take 150 mg by mouth daily.         . risperiDONE (RISPERDAL) 0.25 MG tablet   Oral   Take 0.25 mg by mouth at bedtime.         . traZODone (DESYREL) 50 MG tablet   Oral   Take 50 mg by mouth at bedtime.         Marland Kitchen oxyCODONE-acetaminophen (PERCOCET) 5-325 MG per tablet   Oral   Take 1 tablet by mouth every 4 (four) hours as needed for severe pain.   20 tablet   0   . sulfamethoxazole-trimethoprim (BACTRIM DS,SEPTRA DS) 800-160 MG per tablet   Oral   Take 1 tablet by mouth 2 (two) times daily.   20 tablet   0     Allergies Biaxin; Depakote; Duratuss g; Hydrocodone; Nsaids; and Ultram  History reviewed. No pertinent family history.  Social History Social  History  Substance Use Topics  . Smoking status: Current Every Day Smoker  . Smokeless tobacco: None  . Alcohol Use: No    Review of Systems Constitutional: No fever/chills Cardiovascular: Denies chest pain. Respiratory: Denies shortness of breath. Gastrointestinal:   No nausea, no vomiting.  Musculoskeletal: Negative for back pain. Skin: Negative for rash. Positive for abscess Neurological: Negative for headaches, focal weakness or numbness.  10-point ROS otherwise negative.  ____________________________________________   PHYSICAL EXAM:  VITAL SIGNS: ED Triage Vitals  Enc Vitals Group     BP 07/30/15 1027 115/69 mmHg     Pulse Rate 07/30/15 1027 82     Resp 07/30/15 1027 18     Temp 07/30/15 1027 97.3 F (36.3 C)     Temp Source 07/30/15 1027 Oral     SpO2 07/30/15 1027 98 %     Weight 07/30/15 1027 175 lb (79.379 kg)     Height 07/30/15 1027 5\' 5"  (1.651 m)     Head Cir --      Peak Flow --      Pain Score 07/30/15 1028 7     Pain Loc --  Pain Edu? --      Excl. in GC? --     Constitutional: Alert and oriented. Well appearing and in no acute distress. Eyes: Conjunctivae are normal. PERRL. EOMI. Head: Atraumatic. Nose: No congestion/rhinnorhea. Neck: No stridor.   Respiratory: Normal respiratory effort.  No retractions. Gastrointestinal: Soft and nontender. No distention.  Musculoskeletal: No lower extremity tenderness nor edema.  No joint effusions. Neurologic:  Normal speech and language. No gross focal neurologic deficits are appreciated. No gait instability. Skin:  Skin is warm, dry and intact. No rash noted. Left buttocks there is a small 1 cm papule with erythema surrounding. There is very hard center core. There is no area that is fluctuant. Psychiatric: Mood and affect are normal. Speech and behavior are normal.  ____________________________________________   LABS (all labs ordered are listed, but only abnormal results are displayed)  Labs  Reviewed - No data to display  PROCEDURES  Procedure(s) performed: None  Critical Care performed: No  ____________________________________________   INITIAL IMPRESSION / ASSESSMENT AND PLAN / ED COURSE  Pertinent labs & imaging results that were available during my care of the patient were reviewed by me and considered in my medical decision making (see chart for details).  She was discharged on Septra DS and Percocet for pain. Patient is to use warm compresses or sitz baths. She is return to the emergency room if any severe worsening or urgent concerns. ____________________________________________   FINAL CLINICAL IMPRESSION(S) / ED DIAGNOSES  Final diagnoses:  Abscess of buttock, left      Tommi Rumps, PA-C 07/30/15 1146  Gayla Doss, MD 07/30/15 1524

## 2015-07-30 NOTE — ED Notes (Signed)
Patient reports to either a "boil or spider bite" to buttocks area, first noticed yesterday.

## 2016-03-26 ENCOUNTER — Emergency Department: Payer: Worker's Compensation

## 2016-03-26 ENCOUNTER — Emergency Department
Admission: EM | Admit: 2016-03-26 | Discharge: 2016-03-26 | Disposition: A | Discharge status: Home / Self Care | Payer: Worker's Compensation | Attending: Emergency Medicine | Admitting: Emergency Medicine

## 2016-03-26 DIAGNOSIS — Y939 Activity, unspecified: Secondary | ICD-10-CM | POA: Insufficient documentation

## 2016-03-26 DIAGNOSIS — M545 Low back pain, unspecified: Secondary | ICD-10-CM

## 2016-03-26 DIAGNOSIS — W0110XA Fall on same level from slipping, tripping and stumbling with subsequent striking against unspecified object, initial encounter: Secondary | ICD-10-CM | POA: Insufficient documentation

## 2016-03-26 DIAGNOSIS — F17200 Nicotine dependence, unspecified, uncomplicated: Secondary | ICD-10-CM | POA: Insufficient documentation

## 2016-03-26 DIAGNOSIS — Y999 Unspecified external cause status: Secondary | ICD-10-CM | POA: Insufficient documentation

## 2016-03-26 DIAGNOSIS — S8002XA Contusion of left knee, initial encounter: Secondary | ICD-10-CM

## 2016-03-26 DIAGNOSIS — M549 Dorsalgia, unspecified: Secondary | ICD-10-CM | POA: Insufficient documentation

## 2016-03-26 DIAGNOSIS — Y9229 Other specified public building as the place of occurrence of the external cause: Secondary | ICD-10-CM | POA: Insufficient documentation

## 2016-03-26 DIAGNOSIS — F319 Bipolar disorder, unspecified: Secondary | ICD-10-CM | POA: Insufficient documentation

## 2016-03-26 HISTORY — DX: Bipolar disorder, unspecified: F31.9

## 2016-03-26 MED ORDER — METHOCARBAMOL 500 MG PO TABS
1000.0000 mg | ORAL_TABLET | Freq: Once | ORAL | Status: AC
  Administered 2016-03-26: 1000 mg via ORAL
  Filled 2016-03-26: qty 2

## 2016-03-26 MED ORDER — METHOCARBAMOL 750 MG PO TABS: 1500.0000 mg | ORAL_TABLET | Freq: Four times a day (QID) | ORAL | Status: AC

## 2016-03-26 MED ORDER — OXYCODONE-ACETAMINOPHEN 5-325 MG PO TABS: 1.0000 | ORAL_TABLET | ORAL | Status: DC | PRN

## 2016-03-26 MED ORDER — HYDROMORPHONE HCL 1 MG/ML IJ SOLN
1.0000 mg | Freq: Once | INTRAMUSCULAR | Status: AC
  Administered 2016-03-26: 1 mg via INTRAMUSCULAR
  Filled 2016-03-26: qty 1

## 2016-03-26 MED ORDER — KETOROLAC TROMETHAMINE 60 MG/2ML IM SOLN
30.0000 mg | Freq: Once | INTRAMUSCULAR | Status: AC
  Administered 2016-03-26: 30 mg via INTRAMUSCULAR
  Filled 2016-03-26: qty 2

## 2016-03-26 NOTE — ED Notes (Signed)
Pt arrives to ER via POV c/o fall at work. Left knee pain and back pain. Ambulatory since fall. No obvious deformity. Denies hitting head. Pt alert and oriented X4, active, cooperative, pt in NAD. RR even and unlabored, color WNL.

## 2016-03-26 NOTE — Discharge Instructions (Signed)
Take medication as directed.

## 2016-03-26 NOTE — ED Provider Notes (Signed)
[facility] Emergency Department Provider Note   ____________________________________________  Time seen: Approximately 8:14 PM  I have reviewed the triage vital signs and the nursing notes.   HISTORY  Chief Complaint Fall; Knee Pain; and Back Pain    HPI Denise Montoya is a 39 y.o. female patient complain of low back and left knee pain secondary to a fall. Incident occurred at work. Patient was exiting the building slipped on some cardboard boxes. She landed first on the left knee reach out for the door handle and fell landing on her back. She denies any head injuries secondary to this fall. No palliative measures taken for this complaint. Patient state pain is worse with ambulation and flexion of the back. Patient rates the pain as 8/10. Patient described a pain as "sharp".   Past Medical History  Diagnosis Date  . Anxiety   . Bipolar 1 disorder (HCC)     There are no active problems to display for this patient.   Past Surgical History  Procedure Laterality Date  . Salpingectomy      Current Outpatient Rx  Name  Route  Sig  Dispense  Refill  . alprazolam (XANAX) 2 MG tablet   Oral   Take 2 mg by mouth 4 (four) times daily as needed.          Marland Kitchen asenapine (SAPHRIS) 5 MG SUBL 24 hr tablet   Sublingual   Place 10 mg under the tongue 2 (two) times daily.         Marland Kitchen lamoTRIgine (LAMICTAL) 150 MG tablet   Oral   Take 150 mg by mouth daily.         . methocarbamol (ROBAXIN-750) 750 MG tablet   Oral   Take 2 tablets (1,500 mg total) by mouth 4 (four) times daily.   40 tablet   0   . oxyCODONE-acetaminophen (PERCOCET) 5-325 MG per tablet   Oral   Take 1 tablet by mouth every 4 (four) hours as needed for severe pain.   20 tablet   0   . oxyCODONE-acetaminophen (ROXICET) 5-325 MG tablet   Oral   Take 1 tablet by mouth every 4 (four) hours as needed for severe pain.   30 tablet   0   . risperiDONE (RISPERDAL) 0.25 MG tablet  Oral   Take 0.25 mg by mouth at bedtime.         . sulfamethoxazole-trimethoprim (BACTRIM DS,SEPTRA DS) 800-160 MG per tablet   Oral   Take 1 tablet by mouth 2 (two) times daily.   20 tablet   0   . traZODone (DESYREL) 50 MG tablet   Oral   Take 50 mg by mouth at bedtime.           Allergies Biaxin; Depakote; Duratuss g; Hydrocodone; Nsaids; and Ultram  No family history on file.  Social History Social History  Substance Use Topics  . Smoking status: Current Every Day Smoker  . Smokeless tobacco: None  . Alcohol Use: No    Review of Systems Constitutional: No fever/chills Eyes: No visual changes. ENT: No sore throat. Cardiovascular: Denies chest pain. Respiratory: Denies shortness of breath. Gastrointestinal: No abdominal pain.  No nausea, no vomiting.  No diarrhea.  No constipation. Genitourinary: Negative for dysuria. Musculoskeletal: Negative for back pain. Skin: Negative for rash. Neurological: Negative for headaches, focal weakness or numbness. 10-point ROS otherwise negative.  ____________________________________________   PHYSICAL EXAM:  VITAL SIGNS: ED Triage Vitals  Enc Vitals  Group     BP 03/26/16 2006 136/67 mmHg     Pulse Rate 03/26/16 2006 72     Resp 03/26/16 2006 20     Temp 03/26/16 2006 98.8 F (37.1 C)     Temp Source 03/26/16 2006 Oral     SpO2 03/26/16 2006 100 %     Weight 03/26/16 2006 165 lb (74.844 kg)     Height 03/26/16 2006 5\' 5"  (1.651 m)     Head Cir --      Peak Flow --      Pain Score 03/26/16 2006 8     Pain Loc --      Pain Edu? --      Excl. in GC? --     Constitutional: Alert and oriented. Well appearing and in no acute distress. Eyes: Conjunctivae are normal. PERRL. EOMI. Head: Atraumatic. Nose: No congestion/rhinnorhea. Mouth/Throat: Mucous membranes are moist.  Oropharynx non-erythematous. Neck: No stridor.   Hematological/Lymphatic/Immunilogical: No cervical lymphadenopathy. Cardiovascular: Normal  rate, regular rhythm. Grossly normal heart sounds.  Good peripheral circulation. Respiratory: Normal respiratory effort.  No retractions. Lungs CTAB. Gastrointestinal: Soft and nontender. No distention. No abdominal bruits. No CVA tenderness. Musculoskeletal: No lower extremity tenderness nor edema.  No joint effusions. Neurologic:  Normal speech and language. No gross focal neurologic deficits are appreciated. No gait instability. Skin:  Skin is warm, dry and intact. No rash noted. Psychiatric: Mood and affect are normal. Speech and behavior are normal.  ____________________________________________   LABS (all labs ordered are listed, but only abnormal results are displayed)  Labs Reviewed - No data to display ____________________________________________  EKG   ____________________________________________  RADIOLOGY  No acute findings x-ray of the lumbar spine and left knee. ____________________________________________   PROCEDURES  Procedure(s) performed: None  Critical Care performed: No  ____________________________________________   INITIAL IMPRESSION / ASSESSMENT AND PLAN / ED COURSE  Pertinent labs & imaging results that were available during my care of the patient were reviewed by me and considered in my medical decision making (see chart for details).  Back pain and left knee contusion secondary to fall. Discussed x-ray findings with patient. Patient given prescription for tramadol and Robaxin. ____________________________________________   FINAL CLINICAL IMPRESSION(S) / ED DIAGNOSES  Final diagnoses:  Knee contusion, left, initial encounter  Back pain at L4-L5 level      NEW MEDICATIONS STARTED DURING THIS VISIT:  New Prescriptions   METHOCARBAMOL (ROBAXIN-750) 750 MG TABLET    Take 2 tablets (1,500 mg total) by mouth 4 (four) times daily.   OXYCODONE-ACETAMINOPHEN (ROXICET) 5-325 MG TABLET    Take 1 tablet by mouth every 4 (four) hours as needed  for severe pain.     Note:  This document was prepared using Dragon voice recognition software and may include unintentional dictation errors.    Joni Reiningonald K Esmay Amspacher, PA-C 03/26/16 2225  Sharyn CreamerMark Quale, MD 03/27/16 [phone]

## 2016-03-26 NOTE — ED Notes (Signed)
Pt fell at work, has abrasions to back and leg and back pain. Worker's comp requires no bat or drug screen

## 2016-03-26 NOTE — ED Notes (Signed)
Radiology requests we ask the pt if she could be pregnant prior to doing lumbar xr. Pt states has had her fallopian tubes removed and is unable to conceive. Radiology aware

## 2016-05-02 ENCOUNTER — Encounter: Payer: Self-pay | Admitting: Emergency Medicine

## 2016-05-02 ENCOUNTER — Emergency Department: Payer: Self-pay

## 2016-05-02 ENCOUNTER — Emergency Department
Admission: EM | Admit: 2016-05-02 | Discharge: 2016-05-02 | Disposition: A | Discharge status: Home / Self Care | Payer: Self-pay | Attending: Emergency Medicine | Admitting: Emergency Medicine

## 2016-05-02 DIAGNOSIS — F17200 Nicotine dependence, unspecified, uncomplicated: Secondary | ICD-10-CM | POA: Insufficient documentation

## 2016-05-02 DIAGNOSIS — Y929 Unspecified place or not applicable: Secondary | ICD-10-CM | POA: Insufficient documentation

## 2016-05-02 DIAGNOSIS — S9032XA Contusion of left foot, initial encounter: Secondary | ICD-10-CM | POA: Insufficient documentation

## 2016-05-02 DIAGNOSIS — X501XXA Overexertion from prolonged static or awkward postures, initial encounter: Secondary | ICD-10-CM | POA: Insufficient documentation

## 2016-05-02 DIAGNOSIS — Y990 Civilian activity done for income or pay: Secondary | ICD-10-CM | POA: Insufficient documentation

## 2016-05-02 DIAGNOSIS — F319 Bipolar disorder, unspecified: Secondary | ICD-10-CM | POA: Insufficient documentation

## 2016-05-02 DIAGNOSIS — Y9301 Activity, walking, marching and hiking: Secondary | ICD-10-CM | POA: Insufficient documentation

## 2016-05-02 DIAGNOSIS — Z79899 Other long term (current) drug therapy: Secondary | ICD-10-CM | POA: Insufficient documentation

## 2016-05-02 MED ORDER — TRAMADOL HCL 50 MG PO TABS: 50.0000 mg | ORAL_TABLET | Freq: Four times a day (QID) | ORAL | Status: AC | PRN

## 2016-05-02 NOTE — ED Provider Notes (Signed)
[facility] Emergency Department Provider Note  ____________________________________________  Time seen: Approximately 7:38 PM  I have reviewed the triage vital signs and the nursing notes.   HISTORY  Chief Complaint Foot Pain    HPI Denise Montoya is a 40 y.o. female , NAD, presents to the emergency department with one-day history of left foot pain. States she twisted her left foot while walking down stairs last night. Denies any fall. Did not step on anything to cause the injury. Has not noted any open wounds or lacerations. 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 Has taken Tylenol for her pain this afternoon without any relief. Denies any numbness, weakness, tingling. Has been able to bear weight but with pain about the ball of the foot.   Past Medical History  Diagnosis Date  . Anxiety   . Bipolar 1 disorder (HCC)     There are no active problems to display for this patient.   Past Surgical History  Procedure Laterality Date  . Salpingectomy      Current Outpatient Rx  Name  Route  Sig  Dispense  Refill  . alprazolam (XANAX) 2 MG tablet   Oral   Take 2 mg by mouth 4 (four) times daily as needed.          Marland Kitchen asenapine (SAPHRIS) 5 MG SUBL 24 hr tablet   Sublingual   Place 10 mg under the tongue 2 (two) times daily.         Marland Kitchen lamoTRIgine (LAMICTAL) 150 MG tablet   Oral   Take 150 mg by mouth daily.         . methocarbamol (ROBAXIN-750) 750 MG tablet   Oral   Take 2 tablets (1,500 mg total) by mouth 4 (four) times daily.   40 tablet   0   . oxyCODONE-acetaminophen (PERCOCET) 5-325 MG per tablet   Oral   Take 1 tablet by mouth every 4 (four) hours as needed for severe pain.   20 tablet   0   . oxyCODONE-acetaminophen (ROXICET) 5-325 MG tablet   Oral   Take 1 tablet by mouth every 4 (four) hours as needed for severe  pain.   30 tablet   0   . risperiDONE (RISPERDAL) 0.25 MG tablet   Oral   Take 0.25 mg by mouth at bedtime.         . sulfamethoxazole-trimethoprim (BACTRIM DS,SEPTRA DS) 800-160 MG per tablet   Oral   Take 1 tablet by mouth 2 (two) times daily.   20 tablet   0   . traMADol (ULTRAM) 50 MG tablet   Oral   Take 1 tablet (50 mg total) by mouth every 6 (six) hours as needed.   10 tablet   0   . traZODone (DESYREL) 50 MG tablet   Oral   Take 50 mg by mouth at bedtime.           Allergies Biaxin; Depakote; Duratuss g; Hydrocodone; Nsaids; and Ultram  No family history on file.  Social History Social History  Substance Use Topics  . Smoking status: Current Every Day Smoker  . Smokeless tobacco: None  . Alcohol Use: No     Review of Systems  Constitutional: No fatigue Eyes: No visual changes.  Cardiovascular: No chest pain. Respiratory: No shortness of breath.  Gastrointestinal: No abdominal pain.  No  nausea, vomiting.   Musculoskeletal: Positive left foot pain. Negative for back pain.  Skin: Negative for rash, bruising, open wounds, lacerations, redness, swelling. Neurological: Negative for headaches, focal weakness or numbness. No tingling. No LOC, dizziness. 10-point ROS otherwise negative.  ____________________________________________   PHYSICAL EXAM:  VITAL SIGNS: ED Triage Vitals  Enc Vitals Group     BP 05/02/16 1821 134/72 mmHg     Pulse Rate 05/02/16 1821 75     Resp 05/02/16 1821 18     Temp 05/02/16 1821 98.1 F (36.7 C)     Temp Source 05/02/16 1821 Oral     SpO2 05/02/16 1821 100 %     Weight 05/02/16 1821 160 lb (72.576 kg)     Height 05/02/16 1821 5\' 5"  (1.651 m)     Head Cir --      Peak Flow --      Pain Score 05/02/16 1822 8     Pain Loc --      Pain Edu? --      Excl. in GC? --      Constitutional: Alert and oriented. Well appearing and in no acute distress. Eyes: Conjunctivae are normal.  Head: Atraumatic. Cardiovascular:   Good peripheral circulation with 2+ pulses noted in the left lower extremity. Capillary refill is brisk about all digits of the left foot. Respiratory: Normal respiratory effort without tachypnea or retractions.  Musculoskeletal: Tenderness to palpation diffusely about the ball of the left foot. Full range of motion of the left ankle, foot, toes with minimal pain is noted. No lower extremity tenderness nor edema.  No joint effusions. Neurologic:  Normal speech and language. No gross focal neurologic deficits are appreciated. Sensation to light touch about the left lower extremity is within normal limits. Skin:  Skin is warm, dry and intact. No rash, bruising, swelling, open wounds or lacerations noted. Psychiatric: Mood and affect are normal. Speech and behavior are normal. Patient exhibits appropriate insight and judgement.   ____________________________________________   LABS  None ____________________________________________  EKG  None ____________________________________________  RADIOLOGY I have personally viewed and evaluated these images (plain radiographs) as part of my medical decision making, as well as reviewing the written report by the radiologist.  Dg Foot Complete Left  05/02/2016  CLINICAL DATA:  Status post trauma yesterday with twisted foot with left foot pain. EXAM: LEFT FOOT - COMPLETE 3+ VIEW COMPARISON:  None. FINDINGS: There is no evidence of fracture or dislocation. There is no evidence of arthropathy or other focal bone abnormality. Soft tissues are unremarkable. IMPRESSION: Negative. Electronically Signed   By: Sherian ReinWei-Chen  Lin M.D.   On: 05/02/2016 18:37    ____________________________________________    PROCEDURES  Procedure(s) performed: None    Medications - No data to display   ____________________________________________   INITIAL IMPRESSION / ASSESSMENT AND PLAN / ED COURSE  Pertinent imaging results that were available during my care of the  patient were reviewed by me and considered in my medical decision making (see chart for details).  Patient's diagnosis is consistent with contusion of left foot. Patient was placed in a postop shoe for comfort care. Patient will be discharged home with prescriptions for Ultram to take as directed. Patient may continue Tylenol as needed for pain. Patient should apply ice to the affected area 20 minutes 3-4 times daily and keep elevated when not ambulate. Patient was given a work note to excuse from work for 2 days to allow healing. Patient is to follow up with her  primary care provider or [facility] if symptoms persist past this treatment course. Patient is given ED precautions to return to the ED for any worsening or new symptoms.      ____________________________________________  FINAL CLINICAL IMPRESSION(S) / ED DIAGNOSES  Final diagnoses:  Foot contusion, left, initial encounter      NEW MEDICATIONS STARTED DURING THIS VISIT:  Discharge Medication List as of 05/02/2016  7:57 PM    START taking these medications   Details  traMADol (ULTRAM) 50 MG tablet Take 1 tablet (50 mg total) by mouth every 6 (six) hours as needed., Starting 05/02/2016, Until Discontinued, Print             Hope Pigeon, PA-C 05/02/16 4540  Phineas Semen, MD 05/02/16 2218

## 2016-05-02 NOTE — Discharge Instructions (Signed)
Foot Contusion A foot contusion is a deep bruise to the foot. Contusions are the result of an injury that caused bleeding under the skin. The contusion may turn blue, purple, or yellow. Minor injuries will give you a painless contusion, but more severe contusions may stay painful and swollen for a few weeks. CAUSES  A foot contusion comes from a direct blow to that area, such as a heavy object falling on the foot. SYMPTOMS   Swelling of the foot.  Discoloration of the foot.  Tenderness or soreness of the foot. DIAGNOSIS  You will have a physical exam and will be asked about your history. You may need an X-ray of your foot to look for a broken bone (fracture).  TREATMENT  An elastic wrap may be recommended to support your foot. Resting, elevating, and applying cold compresses to your foot are often the best treatments for a foot contusion. Over-the-counter medicines may also be recommended for pain control. HOME CARE INSTRUCTIONS   Put ice on the injured area.  Put ice in a plastic bag.  Place a towel between your skin and the bag.  Leave the ice on for 15-20 minutes, 03-04 times a day.  Only take over-the-counter or prescription medicines for pain, discomfort, or fever as directed by your caregiver.  If told, use an elastic wrap as directed. This can help reduce swelling. You may remove the wrap for sleeping, showering, and bathing. If your toes become numb, cold, or blue, take the wrap off and reapply it more loosely.  Elevate your foot with pillows to reduce swelling.  Try to avoid standing or walking while the foot is painful. Do not resume use until instructed by your caregiver. Then, begin use gradually. If pain develops, decrease use. Gradually increase activities that do not cause discomfort until you have normal use of your foot.  See your caregiver as directed. It is very important to keep all follow-up appointments in order to avoid any lasting problems with your foot,  including long-term (chronic) pain. SEEK IMMEDIATE MEDICAL CARE IF:   You have increased redness, swelling, or pain in your foot.  Your swelling or pain is not relieved with medicines.  You have loss of feeling in your foot or are unable to move your toes.  Your foot turns cold or blue.  You have pain when you move your toes.  Your foot becomes warm to the touch.  Your contusion does not improve in 2 days. MAKE SURE YOU:   Understand these instructions.  Will watch your condition.  Will get help right away if you are not doing well or get worse.   This information is not intended to replace advice given to you by your health care provider. Make sure you discuss any questions you have with your health care provider.   Document Released: 09/04/2006 Document Revised: 05/14/2012 Document Reviewed: 07/20/2015 Elsevier Interactive Patient Education 2016 Elsevier Inc[employer]  Cryotherapy Cryotherapy is when you put ice on your injury. Ice helps lessen pain and puffiness (swelling) after an injury. Ice works the best when you start using it in the first 24 to 48 hours after an injury. HOME CARE  Put a dry or damp towel between the ice pack and your skin.  You may press gently on the ice pack.  Leave the ice on for no more than 10 to 20 minutes at a time.  Check your skin after 5 minutes to make sure your skin is okay.  Rest at least  20 minutes between ice pack uses. °· Stop using ice when your skin loses feeling (numbness). °· Do not use ice on someone who cannot tell you when it hurts. This includes small children and people with memory problems (dementia). °GET HELP RIGHT AWAY IF: °· You have white spots on your skin. °· Your skin turns blue or pale. °· Your skin feels waxy or hard. °· Your puffiness gets worse. °MAKE SURE YOU:  °· Understand these instructions. °· Will watch your condition. °· Will get help right away if you are not doing well or get worse. °  °This information is not  intended to replace advice given to you by your health care provider. Make sure you discuss any questions you have with your health care provider. °  °Document Released: 05/01/2008 Document Revised: 02/05/2012 Document Reviewed: 07/06/2011 °Elsevier Interactive Patient Education ©2016 Elsevier Inc. ° °

## 2016-06-01 ENCOUNTER — Emergency Department: Payer: Self-pay

## 2016-06-01 ENCOUNTER — Encounter: Payer: Self-pay | Admitting: Emergency Medicine

## 2016-06-01 ENCOUNTER — Emergency Department
Admission: EM | Admit: 2016-06-01 | Discharge: 2016-06-01 | Disposition: A | Discharge status: Home / Self Care | Payer: Self-pay | Attending: Emergency Medicine | Admitting: Emergency Medicine

## 2016-06-01 DIAGNOSIS — F319 Bipolar disorder, unspecified: Secondary | ICD-10-CM | POA: Insufficient documentation

## 2016-06-01 DIAGNOSIS — Z79899 Other long term (current) drug therapy: Secondary | ICD-10-CM | POA: Insufficient documentation

## 2016-06-01 DIAGNOSIS — F17200 Nicotine dependence, unspecified, uncomplicated: Secondary | ICD-10-CM | POA: Insufficient documentation

## 2016-06-01 DIAGNOSIS — R2242 Localized swelling, mass and lump, left lower limb: Secondary | ICD-10-CM | POA: Insufficient documentation

## 2016-06-01 DIAGNOSIS — M7989 Other specified soft tissue disorders: Secondary | ICD-10-CM

## 2016-06-01 MED ORDER — ACETAMINOPHEN 500 MG PO TABS
1000.0000 mg | ORAL_TABLET | Freq: Once | ORAL | Status: AC
  Administered 2016-06-01: 1000 mg via ORAL
  Filled 2016-06-01: qty 2

## 2016-06-01 MED ORDER — OXYCODONE HCL 5 MG PO TABS
5.0000 mg | ORAL_TABLET | Freq: Once | ORAL | Status: AC
  Administered 2016-06-01: 5 mg via ORAL
  Filled 2016-06-01: qty 1

## 2016-06-01 NOTE — Discharge Instructions (Signed)
Use compression stockings, continue to take Tylenol for pain. Take the next day off and elevate your legs. Edema Edema is an abnormal buildup of fluids in your bodytissues. Edema is somewhatdependent on gravity to pull the fluid to the lowest place in your body. That makes the condition more common in the legs and thighs (lower extremities). Painless swelling of the feet and ankles is common and becomes more likely as you get older. It is also common in looser tissues, like around your eyes.  When the affected area is squeezed, the fluid may move out of that spot and leave a dent for a few moments. This dent is called pitting.  CAUSES  There are many possible causes of edema. Eating too much salt and being on your feet or sitting for a long time can cause edema in your legs and ankles. Hot weather may make edema worse. Common medical causes of edema include:  Heart failure.  Liver disease.  Kidney disease.  Weak blood vessels in your legs.  Cancer.  An injury.  Pregnancy.  Some medications.  Obesity. SYMPTOMS  Edema is usually painless.Your skin may look swollen or shiny.  DIAGNOSIS  Your health care provider may be able to diagnose edema by asking about your medical history and doing a physical exam. You may need to have tests such as X-rays, an electrocardiogram, or blood tests to check for medical conditions that may cause edema.  TREATMENT  Edema treatment depends on the cause. If you have heart, liver, or kidney disease, you need the treatment appropriate for these conditions. General treatment may include:  Elevation of the affected body part above the level of your heart.  Compression of the affected body part. Pressure from elastic bandages or support stockings squeezes the tissues and forces fluid back into the blood vessels. This keeps fluid from entering the tissues.  Restriction of fluid and salt intake.  Use of a water pill (diuretic). These medications are  appropriate only for some types of edema. They pull fluid out of your body and make you urinate more often. This gets rid of fluid and reduces swelling, but diuretics can have side effects. Only use diuretics as directed by your health care provider. HOME CARE INSTRUCTIONS   Keep the affected body part above the level of your heart when you are lying down.   Do not sit still or stand for prolonged periods.   Do not put anything directly under your knees when lying down.  Do not wear constricting clothing or garters on your upper legs.   Exercise your legs to work the fluid back into your blood vessels. This may help the swelling go down.   Wear elastic bandages or support stockings to reduce ankle swelling as directed by your health care provider.   Eat a low-salt diet to reduce fluid if your health care provider recommends it.   Only take medicines as directed by your health care provider. SEEK MEDICAL CARE IF:   Your edema is not responding to treatment.  You have heart, liver, or kidney disease and notice symptoms of edema.  You have edema in your legs that does not improve after elevating them.   You have sudden and unexplained weight gain. SEEK IMMEDIATE MEDICAL CARE IF:   You develop shortness of breath or chest pain.   You cannot breathe when you lie down.  You develop pain, redness, or warmth in the swollen areas.   You have heart, liver, or kidney disease  and suddenly get edema.  You have a fever and your symptoms suddenly get worse. MAKE SURE YOU:   Understand these instructions.  Will watch your condition.  Will get help right away if you are not doing well or get worse.   This information is not intended to replace advice given to you by your health care provider. Make sure you discuss any questions you have with your health care provider.   Document Released: 11/13/2005 Document Revised: 12/04/2014 Document Reviewed: 09/05/2013 Elsevier  Interactive Patient Education [employer].

## 2016-06-01 NOTE — ED Notes (Signed)
Patient transported to Ultrasound 

## 2016-06-01 NOTE — ED Provider Notes (Signed)
[facility] Emergency Department Provider Note  ____________________________________________  Time seen: Approximately 7:52 AM  I have reviewed the triage vital signs and the nursing notes.   HISTORY  Chief Complaint Leg Swelling   HPI Burman FreestoneJulia E Grell is a 40 y.o. female with a history of bipolar disorder who presents for evaluation of left leg swelling. Patient reports that she fell in April on top of her leg. She was seen here with negative x-rays. She reports that since then she has episodes of intermittent swelling they usually resolve and she elevates her leg. She reports over the course of the last 4 days the swelling has gotten progressively worse located only on her left leg, radiating down to her foot. She also endorses sharp, moderate pain in on her left leg and it radiates down to her foot that has been constant over the last 2 days. 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 She denies personal or family history blood clots, recent travel or immobilization, exogenous hormones, history of cancer, chest pain, shortness breath, hemoptysis.  Past Medical History  Diagnosis Date  . Anxiety   . Bipolar 1 disorder (HCC)     There are no active problems to display for this patient.   Past Surgical History  Procedure Laterality Date  . Salpingectomy      Current Outpatient Rx  Name  Route  Sig  Dispense  Refill  . alprazolam (XANAX) 2 MG tablet   Oral   Take 2 mg by mouth 4 (four) times daily as needed.          Marland Kitchen. asenapine (SAPHRIS) 5 MG SUBL 24 hr tablet   Sublingual   Place 10 mg under the tongue 2 (two) times daily.         Marland Kitchen. lamoTRIgine (LAMICTAL) 150 MG tablet   Oral   Take 150 mg by mouth daily.         . methocarbamol (ROBAXIN-750) 750 MG tablet   Oral   Take 2 tablets (1,500 mg total) by  mouth 4 (four) times daily.   40 tablet   0   . oxyCODONE-acetaminophen (PERCOCET) 5-325 MG per tablet   Oral   Take 1 tablet by mouth every 4 (four) hours as needed for severe pain.   20 tablet   0   . risperiDONE (RISPERDAL) 0.25 MG tablet   Oral   Take 0.25 mg by mouth at bedtime.         . traMADol (ULTRAM) 50 MG tablet   Oral   Take 1 tablet (50 mg total) by mouth every 6 (six) hours as needed.   10 tablet   0   . traZODone (DESYREL) 50 MG tablet   Oral   Take 50 mg by mouth at bedtime.         Marland Kitchen. oxyCODONE-acetaminophen (ROXICET) 5-325 MG tablet   Oral   Take 1 tablet by mouth every 4 (four) hours as needed for severe pain.   30 tablet   0     Allergies Biaxin; Depakote; Duratuss g; Hydrocodone; Nsaids; and Ultram  History reviewed. No pertinent family history.  Social History Social History  Substance Use Topics  . Smoking status: Current Every Day Smoker  . Smokeless tobacco: None  . Alcohol Use: No    Review of  Systems  Constitutional: Negative for fever. Eyes: Negative for visual changes. ENT: Negative for sore throat. Cardiovascular: Negative for chest pain. Respiratory: Negative for shortness of breath. Gastrointestinal: Negative for abdominal pain, vomiting or diarrhea. Genitourinary: Negative for dysuria. Musculoskeletal: Negative for back pain.+ Left leg swelling and pain Skin: Negative for rash. Neurological: Negative for headaches, weakness or numbness.  ____________________________________________   PHYSICAL EXAM:  VITAL SIGNS: ED Triage Vitals  Enc Vitals Group     BP 06/01/16 0736 101/53 mmHg     Pulse Rate 06/01/16 0736 74     Resp 06/01/16 0736 20     Temp 06/01/16 0736 97.7 F (36.5 C)     Temp Source 06/01/16 0736 Oral     SpO2 06/01/16 0736 100 %     Weight 06/01/16 0736 166 lb (75.297 kg)     Height 06/01/16 0736 5\' 5"  (1.651 m)     Head Cir --      Peak Flow --      Pain Score 06/01/16 0737 7     Pain Loc --        Pain Edu? --      Excl. in GC? --     Constitutional: Alert and oriented. Well appearing and in no apparent distress. HEENT:      Head: Normocephalic and atraumatic.         Eyes: Conjunctivae are normal. Sclera is non-icteric. EOMI. PERRL      Mouth/Throat: Mucous membranes are moist.       Neck: Supple with no signs of meningismus. Cardiovascular: Regular rate and rhythm. No murmurs, gallops, or rubs. 2+ symmetrical distal pulses are present in all extremities. No JVD. Respiratory: Normal respiratory effort. Lungs are clear to auscultation bilaterally. No wheezes, crackles, or rhonchi.  Musculoskeletal: Left leg has mild. Edema and is swollen compared to the right, negative Homans sign, strong distal pulses and perfusion intact, no obvious deformities. Patient is tender to palpation on the medial malleolus and on the base of the fifth metatarsal . No ecchymosis or hematomas. No cords palpable Neurologic: Normal speech and language. Face is symmetric. Moving all extremities. No gross focal neurologic deficits are appreciated. Skin: Skin is warm, dry and intact. No rash noted. Psychiatric: Mood and affect are normal. Speech and behavior are normal.  ____________________________________________   LABS (all labs ordered are listed, but only abnormal results are displayed)  Labs Reviewed - No data to display ____________________________________________  EKG  None  ____________________________________________  RADIOLOGY  I, Nita Sicklearolina Daishia Fetterly, personally viewed and evaluated these images (plain radiographs) as part of my medical decision making, as well as reviewing the written report by the radiologist.  Negative x-rays and Doppler  ____________________________________________   PROCEDURES  Procedure(s) performed: None Critical Care performed:  None ____________________________________________   INITIAL IMPRESSION / ASSESSMENT AND PLAN / ED COURSE  40 y.o. female with  a history of bipolar disorder who presents for evaluation of left leg swelling worsening over the course of 4 days. Differential diagnosis including stress fracture versus DVT. Plan for x-rays of the left ankle and foot and Doppler of her left lower extremity. We'll treat her pain with by mouth Tylenol and oxycodone.  ----------------------------------------- 10:18 AM on 06/01/2016 -----------------------------------------  X-ray and Doppler is negative. We'll discharge home at compression stockings, [facility] the [facility], follow-up with primary care doctor, Tylenol for pain.  Pertinent labs & imaging results that were available during my care of the patient were reviewed by me and considered in  my medical decision making (see chart for details).   I discussed my evaluation of the patient's symptoms, my clinical impression, and my proposed outpatient treatment plan with patient/ family members. We have discussed anticipatory guidance, scheduled follow-up, and careful return precautions. The patient expresses understanding and is comfortable with the discharge plan. All patient's questions were answered.    ____________________________________________   FINAL CLINICAL IMPRESSION(S) / ED DIAGNOSES  Final diagnoses:  Left leg swelling      NEW MEDICATIONS STARTED DURING THIS VISIT:  New Prescriptions   No medications on file     Note:  This document was prepared using Dragon voice recognition software and may include unintentional dictation errors.    Nita Sickle, MD 06/01/16 1019

## 2016-06-01 NOTE — ED Notes (Signed)
Pt to ed with c/o left leg, ankle and foot swelling x 4 days.  Pt states swelling and pain radiates up left leg and into lower back.  Pt reports fall in April and swelling and pain in left leg since.

## 2016-06-27 ENCOUNTER — Encounter: Payer: Self-pay | Admitting: Emergency Medicine

## 2016-06-27 ENCOUNTER — Emergency Department
Admission: EM | Admit: 2016-06-27 | Discharge: 2016-06-27 | Disposition: A | Discharge status: Home / Self Care | Payer: Medicaid Other | Attending: Student | Admitting: Student

## 2016-06-27 DIAGNOSIS — T63421D Toxic effect of venom of ants, accidental (unintentional), subsequent encounter: Secondary | ICD-10-CM | POA: Insufficient documentation

## 2016-06-27 DIAGNOSIS — L237 Allergic contact dermatitis due to plants, except food: Secondary | ICD-10-CM | POA: Insufficient documentation

## 2016-06-27 DIAGNOSIS — F17200 Nicotine dependence, unspecified, uncomplicated: Secondary | ICD-10-CM | POA: Insufficient documentation

## 2016-06-27 DIAGNOSIS — Z79899 Other long term (current) drug therapy: Secondary | ICD-10-CM | POA: Insufficient documentation

## 2016-06-27 MED ORDER — HYDROXYZINE HCL 50 MG PO TABS
50.0000 mg | ORAL_TABLET | Freq: Once | ORAL | Status: AC
  Administered 2016-06-27: 50 mg via ORAL
  Filled 2016-06-27: qty 1

## 2016-06-27 MED ORDER — DEXAMETHASONE SODIUM PHOSPHATE 10 MG/ML IJ SOLN
10.0000 mg | Freq: Once | INTRAMUSCULAR | Status: AC
  Administered 2016-06-27: 10 mg via INTRAMUSCULAR
  Filled 2016-06-27: qty 1

## 2016-06-27 MED ORDER — HYDROXYZINE HCL 25 MG PO TABS: ORAL_TABLET | ORAL | 0 refills | Status: AC

## 2016-06-27 MED ORDER — PREDNISONE 10 MG (21) PO TBPK: ORAL_TABLET | ORAL | 0 refills | Status: AC
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
## 2016-06-27 NOTE — ED Notes (Signed)
Pt left ER at this time no reaction to injection.

## 2016-06-27 NOTE — ED Notes (Signed)
See triage note...the patient c/o rash to limbs and face.  Pt sts rash burns, denies any activity outside yesterday.  Pt sts rash began yesterday.  Skin color WNL, resp even and unlabored.  Pt ambulatory to room.  Pt denies relief w/ Benedryl

## 2016-06-27 NOTE — ED Triage Notes (Signed)
Patient ambulatory to triage with steady gait, without difficulty or distress noted; pt reports areas of redness and itching since yesterday; st remembers getting bit by fire ants

## 2016-09-11 ENCOUNTER — Encounter: Payer: Self-pay | Admitting: Emergency Medicine

## 2016-09-11 ENCOUNTER — Emergency Department
Admission: EM | Admit: 2016-09-11 | Discharge: 2016-09-11 | Disposition: A | Discharge status: Home / Self Care | Payer: Medicaid Other | Attending: Emergency Medicine | Admitting: Emergency Medicine

## 2016-09-11 DIAGNOSIS — F17210 Nicotine dependence, cigarettes, uncomplicated: Secondary | ICD-10-CM | POA: Insufficient documentation

## 2016-09-11 DIAGNOSIS — R112 Nausea with vomiting, unspecified: Secondary | ICD-10-CM | POA: Insufficient documentation

## 2016-09-11 DIAGNOSIS — Z79899 Other long term (current) drug therapy: Secondary | ICD-10-CM | POA: Insufficient documentation

## 2016-09-11 DIAGNOSIS — R197 Diarrhea, unspecified: Secondary | ICD-10-CM | POA: Insufficient documentation

## 2016-09-11 DIAGNOSIS — Z5321 Procedure and treatment not carried out due to patient leaving prior to being seen by health care provider: Secondary | ICD-10-CM | POA: Insufficient documentation

## 2016-09-11 DIAGNOSIS — R1084 Generalized abdominal pain: Secondary | ICD-10-CM | POA: Insufficient documentation

## 2016-09-11 LAB — CBC
HEMATOCRIT: 43.9 % (ref 35.0–47.0)
Hemoglobin: 14.7 g/dL (ref 12.0–16.0)
MCH: 29.9 pg (ref 26.0–34.0)
MCHC: 33.6 g/dL (ref 32.0–36.0)
MCV: 89.1 fL (ref 80.0–100.0)
PLATELETS: 204 10*3/uL (ref 150–440)
RBC: 4.93 MIL/uL (ref 3.80–5.20)
RDW: 14.2 % (ref 11.5–14.5)
WBC: 9.2 10*3/uL (ref 3.6–11.0)

## 2016-09-11 LAB — URINALYSIS COMPLETE WITH MICROSCOPIC (ARMC ONLY)
BILIRUBIN URINE: NEGATIVE
Glucose, UA: NEGATIVE mg/dL
KETONES UR: NEGATIVE mg/dL
Leukocytes, UA: NEGATIVE
NITRITE: NEGATIVE
PH: 6 (ref 5.0–8.0)
PROTEIN: NEGATIVE mg/dL
Specific Gravity, Urine: 1.009 (ref 1.005–1.030)

## 2016-09-11 LAB — COMPREHENSIVE METABOLIC PANEL
ALBUMIN: 4.5 g/dL (ref 3.5–5.0)
ALK PHOS: 51 U/L (ref 38–126)
ALT: 12 U/L — ABNORMAL LOW (ref 14–54)
AST: 17 U/L (ref 15–41)
Anion gap: 7 (ref 5–15)
BILIRUBIN TOTAL: 0.8 mg/dL (ref 0.3–1.2)
CALCIUM: 9.5 mg/dL (ref 8.9–10.3)
CO2: 27 mmol/L (ref 22–32)
CREATININE: 0.61 mg/dL (ref 0.44–1.00)
Chloride: 106 mmol/L (ref 101–111)
GFR calc Af Amer: >60 mL/min (ref >60)
GFR calc non Af Amer: >60 mL/min (ref >60)
GLUCOSE: 108 mg/dL — AB (ref 65–99)
Potassium: 3.5 mmol/L (ref 3.5–5.1)
Sodium: 140 mmol/L (ref 135–145)
TOTAL PROTEIN: 7.6 g/dL (ref 6.5–8.1)

## 2016-09-11 LAB — LIPASE, BLOOD: Lipase: 22 U/L (ref 11–51)

## 2016-09-11 NOTE — ED Triage Notes (Signed)
Pt presents to ED with c/o generalized abdominal pain accompanied by nausea, vomiting, and diarrhea. Denies fevers. Pt reports "I already know what it is, my children where sick with a stomach virus and that's what I have." Pt denies chest pain or shortness of breath. Pt denies urinary symptoms.

## 2016-10-10 ENCOUNTER — Emergency Department
Admission: EM | Admit: 2016-10-10 | Discharge: 2016-10-10 | Disposition: A | Discharge status: Home / Self Care | Payer: Medicaid Other | Attending: Emergency Medicine | Admitting: Emergency Medicine

## 2016-10-10 ENCOUNTER — Encounter: Payer: Self-pay | Admitting: *Deleted

## 2016-10-10 DIAGNOSIS — K529 Noninfective gastroenteritis and colitis, unspecified: Secondary | ICD-10-CM | POA: Insufficient documentation

## 2016-10-10 DIAGNOSIS — F17210 Nicotine dependence, cigarettes, uncomplicated: Secondary | ICD-10-CM | POA: Insufficient documentation

## 2016-10-10 LAB — COMPREHENSIVE METABOLIC PANEL
ALBUMIN: 3.8 g/dL (ref 3.5–5.0)
ALK PHOS: 41 U/L (ref 38–126)
ALT: 12 U/L — ABNORMAL LOW (ref 14–54)
ANION GAP: 6 (ref 5–15)
AST: 16 U/L (ref 15–41)
BUN: 5 mg/dL — ABNORMAL LOW (ref 6–20)
CALCIUM: 9.1 mg/dL (ref 8.9–10.3)
CHLORIDE: 103 mmol/L (ref 101–111)
CO2: 29 mmol/L (ref 22–32)
Creatinine, Ser: 0.76 mg/dL (ref 0.44–1.00)
GFR calc non Af Amer: >60 mL/min (ref >60)
GLUCOSE: 83 mg/dL (ref 65–99)
Potassium: 3.3 mmol/L — ABNORMAL LOW (ref 3.5–5.1)
SODIUM: 138 mmol/L (ref 135–145)
Total Bilirubin: 0.5 mg/dL (ref 0.3–1.2)
Total Protein: 6.8 g/dL (ref 6.5–8.1)

## 2016-10-10 LAB — URINALYSIS COMPLETE WITH MICROSCOPIC (ARMC ONLY)
BACTERIA UA: NONE SEEN
Bilirubin Urine: NEGATIVE
Glucose, UA: NEGATIVE mg/dL
KETONES UR: NEGATIVE mg/dL
Leukocytes, UA: NEGATIVE
Nitrite: NEGATIVE
PH: 6 (ref 5.0–8.0)
PROTEIN: NEGATIVE mg/dL
Specific Gravity, Urine: 1.006 (ref 1.005–1.030)

## 2016-10-10 LAB — CBC
HEMATOCRIT: 40.8 % (ref 35.0–47.0)
HEMOGLOBIN: 13.9 g/dL (ref 12.0–16.0)
MCH: 30 pg (ref 26.0–34.0)
MCHC: 34.2 g/dL (ref 32.0–36.0)
MCV: 87.8 fL (ref 80.0–100.0)
Platelets: 212 10*3/uL (ref 150–440)
RBC: 4.65 MIL/uL (ref 3.80–5.20)
RDW: 13.8 % (ref 11.5–14.5)
WBC: 7.2 10*3/uL (ref 3.6–11.0)

## 2016-10-10 LAB — LIPASE, BLOOD: LIPASE: 21 U/L (ref 11–51)

## 2016-10-10 LAB — POCT PREGNANCY, URINE: PREG TEST UR: NEGATIVE

## 2016-10-10 MED ORDER — ONDANSETRON 4 MG PO TBDP: 4.0000 mg | ORAL_TABLET | Freq: Three times a day (TID) | ORAL | 0 refills | Status: AC | PRN

## 2016-10-10 NOTE — ED Provider Notes (Signed)
[facility] Emergency Department Provider Note  Time seen: 10:37 PM  I have reviewed the triage vital signs and the nursing notes.   HISTORY  Chief Complaint Abdominal Pain    HPI Denise Montoya is a 40 y.o. female with a past medical history of anxiety and bipolar disease who presents the emergency department for abdominal pain, nausea, vomiting, diarrhea. According to the patient over the weekend she developed diarrhea and vomiting. States she is having abdominal cramping as well. States the diarrhea has subsided, the vomiting again happened this morning but has since subsided. States her abdominal pain is gone. States the only reason she came to the emergency department is because she works at [employer] and cannot go into work today. Patient denies fever. Denies any abdominal pain at this time. States her last episode of diarrhea was 2 days ago, last vomited this morning. States she has since eaten and drinking without issue.  Past Medical History:  Diagnosis Date  . Anxiety   . Bipolar 1 disorder (HCC)     There are no active problems to display for this patient.   Past Surgical History:  Procedure Laterality Date  . KNEE SURGERY Right   . SALPINGECTOMY    . TUBAL LIGATION      Prior to Admission medications   Medication Sig Start Date End Date Taking? Authorizing Provider  alprazolam Prudy Feeler(XANAX) 2 MG tablet Take 2 mg by mouth 4 (four) times daily as needed.     Historical Provider, MD  asenapine (SAPHRIS) 5 MG SUBL 24 hr tablet Place 10 mg under the tongue 2 (two) times daily.    Historical Provider, MD  hydrOXYzine (ATARAX/VISTARIL) 25 MG tablet 1-2 tablets every 6 hours if needed for itching 06/27/16   Chinita Pesterari B Triplett, FNP  lamoTRIgine (LAMICTAL) 150 MG tablet Take 150 mg by mouth daily.    Historical Provider, MD  methocarbamol (ROBAXIN-750) 750 MG tablet Take 2 tablets (1,500 mg total) by mouth 4 (four) times daily. 03/26/16   Joni Reiningonald K Smith, PA-C   oxyCODONE-acetaminophen (PERCOCET) 5-325 MG per tablet Take 1 tablet by mouth every 4 (four) hours as needed for severe pain. 07/30/15   Tommi Rumpshonda L Summers, PA-C  oxyCODONE-acetaminophen (ROXICET) 5-325 MG tablet Take 1 tablet by mouth every 4 (four) hours as needed for severe pain. 03/26/16   Joni Reiningonald K Smith, PA-C  predniSONE (STERAPRED UNI-PAK 21 TAB) 10 MG (21) TBPK tablet Take 6 tablets on day 1 Take 5 tablets on day 2 Take 4 tablets on day 3 Take 3 tablets on day 4 Take 2 tablets on day 5 Take 1 tablet on day 6 06/27/16   Cari B Triplett, FNP  risperiDONE (RISPERDAL) 0.25 MG tablet Take 0.25 mg by mouth at bedtime.    Historical Provider, MD  traMADol (ULTRAM) 50 MG tablet Take 1 tablet (50 mg total) by mouth every 6 (six) hours as needed. 05/02/16   Jami L Hagler, PA-C  traZODone (DESYREL) 50 MG tablet Take 50 mg by mouth at bedtime.    Historical Provider, MD    Allergies  Allergen Reactions  . Biaxin [Clarithromycin]   . Depakote [Divalproex Sodium]   . Duratuss G [Guaifenesin]   . Hydrocodone   . Nsaids   . Ultram [Tramadol Hcl]     No family history on file.  Social History Social History  Substance Use Topics  . Smoking status: Current Every Day Smoker    Packs/day: 1.00    Types: Cigarettes  .  Smokeless tobacco: Never Used  . Alcohol use No    Review of Systems Constitutional: Negative for fever. Cardiovascular: Negative for chest pain. Respiratory: Negative for shortness of breath. Gastrointestinal: Abdominal pain, now resolved. Nausea, vomiting, diarrhea, now resolved. Genitourinary: Negative for dysuria. Musculoskeletal: Negative for back pain. Neurological: Negative for headache 10-point ROS otherwise negative.  ____________________________________________   PHYSICAL EXAM:  VITAL SIGNS: ED Triage Vitals  Enc Vitals Group     BP 10/10/16 2056 138/70     Pulse Rate 10/10/16 2056 64     Resp 10/10/16 2056 20     Temp 10/10/16 2056 98.1 F (36.7 C)      Temp Source 10/10/16 2056 Oral     SpO2 10/10/16 2056 98 %     Weight 10/10/16 2056 162 lb (73.5 kg)     Height 10/10/16 2056 5\' 5"  (1.651 m)     Head Circumference --      Peak Flow --      Pain Score 10/10/16 2057 0     Pain Loc --      Pain Edu? --      Excl. in GC? --     Constitutional: Alert and oriented. Well appearing and in no distress. Eyes: Normal exam ENT   Head: Normocephalic and atraumatic.   Mouth/Throat: Mucous membranes are moist. Cardiovascular: Normal rate, regular rhythm. No murmur Respiratory: Normal respiratory effort without tachypnea nor retractions. Breath sounds are clear  Gastrointestinal: Soft and nontender. No distention.  Musculoskeletal: Nontender with normal range of motion in all extremities.  Neurologic:  Normal speech and language. No gross focal neurologic deficits  Skin:  Skin is warm, dry and intact.  Psychiatric: Mood and affect are normal.   ____________________________________________     INITIAL IMPRESSION / ASSESSMENT AND PLAN / ED COURSE  Pertinent labs & imaging results that were available during my care of the patient were reviewed by me and considered in my medical decision making (see chart for details).  Patient presents the emergency department nausea, vomiting, diarrhea and abdominal cramping which have since resolved. Patient's workup is very normal, labs are within normal limits. Urinalysis negative, pregnancy test negative. WBC count is normal. Patient has a nontender abdominal exam. We'll discharge home with supportive care and Zofran to be used as needed for nausea. Patient agreeable plan.   ____________________________________________   FINAL CLINICAL IMPRESSION(S) / ED DIAGNOSES  Gastroenteritis    Minna AntisKevin Dyllin Gulley, MD 10/10/16 2239

## 2016-10-10 NOTE — ED Triage Notes (Addendum)
Pt reports abd pain with v/d for past 3 days.  Pt reports belching a lot.    No vag bleeding no back pain no urinary sx.  Pt alert.

## 2019-08-25 ENCOUNTER — Encounter (HOSPITAL_COMMUNITY): Payer: Self-pay | Admitting: *Deleted

## 2019-08-25 ENCOUNTER — Emergency Department (HOSPITAL_COMMUNITY)
Admission: EM | Admit: 2019-08-25 | Discharge: 2019-08-25 | Disposition: A | Discharge status: Home / Self Care | Payer: Medicaid Other | Attending: Emergency Medicine | Admitting: Emergency Medicine

## 2019-08-25 ENCOUNTER — Other Ambulatory Visit: Payer: Self-pay

## 2019-08-25 DIAGNOSIS — Y999 Unspecified external cause status: Secondary | ICD-10-CM | POA: Diagnosis not present

## 2019-08-25 DIAGNOSIS — Z23 Encounter for immunization: Secondary | ICD-10-CM | POA: Insufficient documentation

## 2019-08-25 DIAGNOSIS — T25221A Burn of second degree of right foot, initial encounter: Secondary | ICD-10-CM | POA: Insufficient documentation

## 2019-08-25 DIAGNOSIS — T25021A Burn of unspecified degree of right foot, initial encounter: Secondary | ICD-10-CM | POA: Diagnosis present

## 2019-08-25 DIAGNOSIS — Z79899 Other long term (current) drug therapy: Secondary | ICD-10-CM | POA: Insufficient documentation

## 2019-08-25 DIAGNOSIS — T300 Burn of unspecified body region, unspecified degree: Secondary | ICD-10-CM

## 2019-08-25 DIAGNOSIS — Y939 Activity, unspecified: Secondary | ICD-10-CM | POA: Insufficient documentation

## 2019-08-25 DIAGNOSIS — F17210 Nicotine dependence, cigarettes, uncomplicated: Secondary | ICD-10-CM | POA: Insufficient documentation

## 2019-08-25 DIAGNOSIS — Y929 Unspecified place or not applicable: Secondary | ICD-10-CM | POA: Insufficient documentation

## 2019-08-25 DIAGNOSIS — X118XXA Contact with other hot tap-water, initial encounter: Secondary | ICD-10-CM | POA: Insufficient documentation

## 2019-08-25 MED ORDER — IBUPROFEN 600 MG PO TABS: 600.0000 mg | ORAL_TABLET | Freq: Four times a day (QID) | ORAL | 0 refills | Status: AC | PRN

## 2019-08-25 MED ORDER — OXYCODONE-ACETAMINOPHEN 5-325 MG PO TABS
1.0000 | ORAL_TABLET | Freq: Once | ORAL | Status: AC
  Administered 2019-08-25: 1 via ORAL
  Filled 2019-08-25: qty 1

## 2019-08-25 MED ORDER — OXYCODONE-ACETAMINOPHEN 5-325 MG PO TABS: 1.0000 | ORAL_TABLET | ORAL | 0 refills | Status: AC | PRN

## 2019-08-25 MED ORDER — TETANUS-DIPHTH-ACELL PERTUSSIS 5-2.5-18.5 LF-MCG/0.5 IM SUSP
0.5000 mL | Freq: Once | INTRAMUSCULAR | Status: AC
  Administered 2019-08-25: 0.5 mL via INTRAMUSCULAR
  Filled 2019-08-25: qty 0.5

## 2019-08-25 MED ORDER — SILVER SULFADIAZINE 1 % EX CREA: 1.0000 "application " | TOPICAL_CREAM | Freq: Every day | CUTANEOUS | 0 refills | Status: AC

## 2019-08-25 MED ORDER — SILVER SULFADIAZINE 1 % EX CREA
TOPICAL_CREAM | Freq: Once | CUTANEOUS | Status: AC
  Administered 2019-08-25: via TOPICAL
  Filled 2019-08-25: qty 85
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
## 2019-08-25 NOTE — ED Notes (Signed)
Patient verbalizes understanding of discharge instructions. Opportunity for questioning and answers were provided. Armband removed by staff, pt discharged from ED.  

## 2019-08-25 NOTE — Discharge Instructions (Addendum)
Keep the area clean and apply topical silvadene daily. It is important that the wound be rechecked in 2-3 days. Take medications as prescribed.
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
## 2020-08-12 ENCOUNTER — Encounter: Payer: Self-pay | Admitting: Obstetrics & Gynecology

## 2020-09-01 ENCOUNTER — Encounter: Payer: Self-pay | Admitting: Obstetrics & Gynecology

## 2020-09-24 ENCOUNTER — Encounter: Payer: Self-pay | Admitting: Obstetrics & Gynecology

## 2020-10-26 ENCOUNTER — Encounter: Payer: Self-pay | Admitting: Obstetrics & Gynecology

## 2020-11-17 ENCOUNTER — Encounter: Payer: Self-pay | Admitting: Obstetrics & Gynecology

## 2020-12-20 ENCOUNTER — Encounter: Payer: Self-pay | Admitting: Obstetrics & Gynecology

## 2020-12-24 ENCOUNTER — Encounter: Payer: Self-pay | Admitting: Obstetrics & Gynecology

## 2021-01-04 ENCOUNTER — Encounter: Payer: Self-pay | Admitting: Obstetrics and Gynecology

## 2022-05-01 ENCOUNTER — Emergency Department (HOSPITAL_COMMUNITY)
Admission: EM | Admit: 2022-05-01 | Discharge: 2022-05-02 | Discharge status: Against Medical Advice | Payer: Medicaid Other | Attending: Emergency Medicine | Admitting: Emergency Medicine

## 2022-05-01 ENCOUNTER — Emergency Department (HOSPITAL_COMMUNITY): Payer: Medicaid Other

## 2022-05-01 ENCOUNTER — Other Ambulatory Visit: Payer: Self-pay

## 2022-05-01 DIAGNOSIS — Z5321 Procedure and treatment not carried out due to patient leaving prior to being seen by health care provider: Secondary | ICD-10-CM | POA: Diagnosis not present

## 2022-05-01 DIAGNOSIS — M7989 Other specified soft tissue disorders: Secondary | ICD-10-CM | POA: Diagnosis not present

## 2022-05-01 DIAGNOSIS — R0602 Shortness of breath: Secondary | ICD-10-CM | POA: Diagnosis not present

## 2022-05-01 DIAGNOSIS — R0789 Other chest pain: Secondary | ICD-10-CM | POA: Diagnosis present

## 2022-05-01 LAB — BASIC METABOLIC PANEL
Anion gap: 8 (ref 5–15)
BUN: 6 mg/dL (ref 6–20)
CO2: 22 mmol/L (ref 22–32)
Calcium: 8.9 mg/dL (ref 8.9–10.3)
Chloride: 103 mmol/L (ref 98–111)
Creatinine, Ser: 0.9 mg/dL (ref 0.44–1.00)
GFR, Estimated: >60 mL/min (ref >60)
Glucose, Bld: 93 mg/dL (ref 70–99)
Potassium: 3.8 mmol/L (ref 3.5–5.1)
Sodium: 133 mmol/L — ABNORMAL LOW (ref 135–145)

## 2022-05-01 LAB — CBC
HCT: 39.4 % (ref 36.0–46.0)
Hemoglobin: 13 g/dL (ref 12.0–15.0)
MCH: 29.5 pg (ref 26.0–34.0)
MCHC: 33 g/dL (ref 30.0–36.0)
MCV: 89.5 fL (ref 80.0–100.0)
Platelets: 231 10*3/uL (ref 150–400)
RBC: 4.4 MIL/uL (ref 3.87–5.11)
RDW: 12.8 % (ref 11.5–15.5)
WBC: 8.9 10*3/uL (ref 4.0–10.5)
nRBC: 0 % (ref 0.0–0.2)

## 2022-05-01 LAB — I-STAT BETA HCG BLOOD, ED (MC, WL, AP ONLY): I-stat hCG, quantitative: <5 m[IU]/mL (ref <5)

## 2022-05-01 LAB — TROPONIN I (HIGH SENSITIVITY): Troponin I (High Sensitivity): 3 ng/L (ref <18)

## 2022-05-01 NOTE — ED Provider Triage Note (Signed)
Emergency Medicine Provider Triage Evaluation Note  Denise Montoya , a 46 y.o. female  was evaluated in triage.  Pt complains of CP, SOB intermittently x 1 week. Worsening since 3 am today. Worsening of CP and SOB with exertion. BLE edema x 2 weeks. No travel, immobilization, surgery, or hx of clot.  Review of Systems  Positive: CP, SOB, BLE edema, congestion, cough, lightheaded, decreased urine output Negative: Abdominal pain, N/V/D  Physical Exam  BP (!) 141/77   Pulse 75   Temp 97.7 F (36.5 C) (Oral)   Resp (!) 24   SpO2 100%  Gen:   Awake, obviously dyspneic  Resp:  Tachypnea, lungs CTAB MSK:   Moves extremities without difficulty  Other:  BLE edema, 2+ no pitting. RRR no m/r/g  Medical Decision Making  Medically screening exam initiated at 11:27 PM.  Appropriate orders placed.  Denise Montoya was informed that the remainder of the evaluation will be completed by another provider, this initial triage assessment does not replace that evaluation, and the importance of remaining in the ED until their evaluation is complete.  Reports recent DVT study with PCP, per patient negative of right leg. No exam of left.   This chart was dictated using voice recognition software, Dragon. Despite the best efforts of this provider to proofread and correct errors, errors may still occur which can change documentation meaning.    Paris Lore, PA-C 05/01/22 [phone]

## 2022-05-01 NOTE — ED Triage Notes (Signed)
Pt sent here by [facility] for cp, shob that started yesterday and swelling to bilateral lower extremities x1 week. Pt reports she feels a "swoshing" in her ears, has had productive cough w/ green phlegm and congestion. She reports chest tightness worse w/ movement.

## 2022-05-02 LAB — TROPONIN I (HIGH SENSITIVITY): Troponin I (High Sensitivity): 2 ng/L (ref <18)

## 2022-05-02 LAB — BRAIN NATRIURETIC PEPTIDE: B Natriuretic Peptide: 11.6 pg/mL (ref 0.0–100.0)

## 2022-05-02 NOTE — ED Notes (Signed)
Pt name call 3x to be room. Not in bathroom, not outside. X-ray called, was placed back in waiting room earlier.

## 2022-05-16 ENCOUNTER — Encounter: Payer: Medicaid Other | Admitting: Advanced Practice Midwife

## 2022-06-02 ENCOUNTER — Encounter: Payer: Medicaid Other | Admitting: Advanced Practice Midwife

## 2022-06-16 ENCOUNTER — Encounter: Payer: Medicaid Other | Admitting: Advanced Practice Midwife

## 2022-07-05 ENCOUNTER — Encounter: Payer: Medicaid Other | Admitting: Obstetrics & Gynecology

## 2022-07-05 ENCOUNTER — Telehealth: Payer: Self-pay | Admitting: Obstetrics & Gynecology

## 2022-07-05 NOTE — Telephone Encounter (Signed)
I contacted patient via phone due to [facility] having computer issues and we are needing to rescheduled. I lreft voicemail for patient to call back to confirm new scheduled appointment. Patient was rescheduled to 07/25/22 at 9:15 with Nicholaus Bloom , MD.

## 2022-07-25 ENCOUNTER — Encounter: Payer: Medicaid Other | Admitting: Obstetrics & Gynecology

## 2024-01-24 ENCOUNTER — Other Ambulatory Visit: Payer: Self-pay | Admitting: Primary Care

## 2024-01-24 DIAGNOSIS — R1011 Right upper quadrant pain: Secondary | ICD-10-CM

## 2024-02-13 ENCOUNTER — Ambulatory Visit: Attending: Primary Care

## 2024-03-25 ENCOUNTER — Ambulatory Visit: Attending: Primary Care

## 2024-10-15 ENCOUNTER — Encounter (HOSPITAL_COMMUNITY): Payer: Self-pay

## 2024-10-15 ENCOUNTER — Ambulatory Visit (HOSPITAL_COMMUNITY)
Admission: EM | Admit: 2024-10-15 | Discharge: 2024-10-15 | Disposition: A | Discharge status: Home / Self Care | Payer: MEDICAID

## 2024-10-15 DIAGNOSIS — L03113 Cellulitis of right upper limb: Secondary | ICD-10-CM

## 2024-10-15 MED ORDER — CLINDAMYCIN HCL 150 MG PO CAPS: 450.0000 mg | ORAL_CAPSULE | Freq: Two times a day (BID) | ORAL | 0 refills | Status: AC

## 2024-10-15 NOTE — Discharge Instructions (Signed)
  1. Cellulitis of hand, right (Primary) - clindamycin (CLEOCIN) 150 MG capsule; Take 3 capsules (450 mg total) by mouth 2 (two) times daily for 7 days.  Dispense: 42 capsule; Refill: 0 - Keep wound covered when working around chemicals or in dirty environment to prevent any secondary infiltration of bacteria - Take ibuprofen  600 mg every 8 hours as needed for pain and inflammation to the hand secondary to infection.  -Continue to monitor symptoms for any change in severity if there is any escalation of current symptoms or development of new symptoms follow-up in ER for further evaluation and management.
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
## 2024-10-15 NOTE — ED Provider Notes (Signed)
 [facility] [place]  Note:  This document was prepared using Conservation officer, historic buildings and may include unintentional dictation errors.  MRN: [id number] DOB: 09-22-1976  Subjective:   Denise Montoya is a 48 y.o. female presenting for evaluation of right hand injury/infection.  Patient reports that she was cleaning her mother's house when she scraped the dorsum of her right hand 3 days ago.  Patient reports since that time she has had increased swelling and redness and now increased pain.  Patient denies any purulent drainage from the wound, fever, bleeding.  Patient states that lately whenever she gets any cut, the wound gets infected and she has to take antibiotics.  Patient denies taking any over-the-counter medication to treat symptoms prior to arrival in urgent care.  No current facility-administered medications for this encounter.  Current Outpatient Medications:    clindamycin (CLEOCIN) 150 MG capsule, Take 3 capsules (450 mg total) by mouth 2 (two) times daily for 7 days., Disp: 42 capsule, Rfl: 0   alprazolam (XANAX) 2 MG tablet, Take 2 mg by mouth 4 (four) times daily as needed. , Disp: , Rfl:    asenapine (SAPHRIS) 5 MG SUBL 24 hr tablet, Place 10 mg under the tongue 2 (two) times daily., Disp: , Rfl:    hydrOXYzine  (ATARAX /VISTARIL ) 25 MG tablet, 1-2 tablets every 6 hours if needed for itching, Disp: 30 tablet, Rfl: 0   ibuprofen  (ADVIL ) 600 MG tablet, Take 1 tablet (600 mg total) by mouth every 6 (six) hours as needed., Disp: 30 tablet, Rfl: 0   lamoTRIgine (LAMICTAL) 150 MG tablet, Take 150 mg by mouth daily., Disp: , Rfl:    methocarbamol  (ROBAXIN -750) 750 MG tablet, Take 2 tablets (1,500 mg total) by mouth 4 (four) times daily., Disp: 40 tablet, Rfl: 0   ondansetron  (ZOFRAN  ODT) 4 MG disintegrating tablet, Take 1 tablet (4 mg total) by mouth every 8 (eight) hours as needed for nausea or vomiting., Disp: 20 tablet, Rfl: 0   oxyCODONE -acetaminophen  (PERCOCET/ROXICET)  5-325 MG tablet, Take 1 tablet by mouth every 4 (four) hours as needed for severe pain., Disp: 8 tablet, Rfl: 0   predniSONE  (STERAPRED UNI-PAK 21 TAB) 10 MG (21) TBPK tablet, Take 6 tablets on day 1 Take 5 tablets on day 2 Take 4 tablets on day 3 Take 3 tablets on day 4 Take 2 tablets on day 5 Take 1 tablet on day 6, Disp: 21 tablet, Rfl: 0   risperiDONE (RISPERDAL) 0.25 MG tablet, Take 0.25 mg by mouth at bedtime., Disp: , Rfl:    silver  sulfADIAZINE  (SILVADENE ) 1 % cream, Apply 1 application topically daily., Disp: 50 g, Rfl: 0   traMADol  (ULTRAM ) 50 MG tablet, Take 1 tablet (50 mg total) by mouth every 6 (six) hours as needed., Disp: 10 tablet, Rfl: 0   traZODone (DESYREL) 50 MG tablet, Take 50 mg by mouth at bedtime., Disp: , Rfl:    Allergies  Allergen Reactions   Biaxin [Clarithromycin]    Depakote [Divalproex Sodium]    Duratuss G [Guaifenesin]    Hydrocodone    Nsaids    Ultram  [Tramadol  Hcl]     Past Medical History:  Diagnosis Date   Anxiety    Bipolar 1 disorder (HCC)      Past Surgical History:  Procedure Laterality Date   KNEE SURGERY Right    SALPINGECTOMY     TUBAL LIGATION      History reviewed. No pertinent family history.  Social History   Tobacco Use  Smoking status: Every Day    Current packs/day: 1.00    Types: Cigarettes   Smokeless tobacco: Never  Substance Use Topics   Alcohol use: No   Drug use: No    ROS Refer to HPI for ROS details.  Objective:    Vitals: BP (!) 140/94 (BP Location: Right Arm)   Pulse 76   Temp 98.5 F (36.9 C) (Oral)   Resp 16   SpO2 97%   Physical Exam Vitals and nursing note reviewed.  Constitutional:      General: She is not in acute distress.    Appearance: Normal appearance. She is not ill-appearing.  HENT:     Head: Normocephalic.  Cardiovascular:     Rate and Rhythm: Normal rate.  Pulmonary:     Effort: Pulmonary effort is normal. No respiratory distress.  Musculoskeletal:        Hands:  Skin:    General: Skin is warm and dry.     Capillary Refill: Capillary refill takes less than 2 seconds.     Findings: Erythema and wound present.  Neurological:     General: No focal deficit present.     Mental Status: She is alert and oriented to person, place, and time.  Psychiatric:        Mood and Affect: Mood normal.        Behavior: Behavior normal.     Procedures  No results found for this or any previous visit (from the past 24 hours).  Assessment and Plan :     Discharge Instructions       1. Cellulitis of hand, right (Primary) - clindamycin (CLEOCIN) 150 MG capsule; Take 3 capsules (450 mg total) by mouth 2 (two) times daily for 7 days.  Dispense: 42 capsule; Refill: 0 - Keep wound covered when working around chemicals or in dirty environment to prevent any secondary infiltration of bacteria - Take ibuprofen  600 mg every 8 hours as needed for pain and inflammation to the hand secondary to infection.  -Continue to monitor symptoms for any change in severity if there is any escalation of current symptoms or development of new symptoms follow-up in ER for further evaluation and management.      Denise Montoya Denise Montoya   Denise Montoya, Denise Montoya, Denise Montoya 10/15/24 [phone]

## 2024-12-31 ENCOUNTER — Encounter: Payer: Self-pay | Admitting: Internal Medicine

## 2025-01-07 ENCOUNTER — Ambulatory Visit: Admission: RE | Admit: 2025-01-07 | Payer: MEDICAID | Admitting: Internal Medicine

## 2025-01-07 ENCOUNTER — Encounter: Admission: RE | Payer: Self-pay
